# Patient Record
Sex: Female | Born: 1960 | Hispanic: Yes | Marital: Married | Smoking: Never smoker
Health system: Southern US, Community
[De-identification: ages and names within clinical notes are randomized; demographics above are authoritative.]

---

## 2009-04-02 ENCOUNTER — Ambulatory Visit: Payer: Self-pay | Admitting: Internal Medicine

## 2009-04-02 ENCOUNTER — Encounter (INDEPENDENT_AMBULATORY_CARE_PROVIDER_SITE_OTHER): Payer: Self-pay | Admitting: Family Medicine

## 2009-04-02 LAB — CONVERTED CEMR LAB
AST: 16 units/L (ref 0–37)
BUN: 18 mg/dL (ref 6–23)
Basophils Relative: 1 % (ref 0–1)
CO2: 26 meq/L (ref 19–32)
Calcium: 9.2 mg/dL (ref 8.4–10.5)
Chloride: 103 meq/L (ref 96–112)
Creatinine, Ser: 0.68 mg/dL (ref 0.40–1.20)
Eosinophils Relative: 2 % (ref 0–5)
Glucose, Bld: 86 mg/dL (ref 70–99)
HCT: 42 % (ref 36.0–46.0)
Hemoglobin: 13.2 g/dL (ref 12.0–15.0)
Lymphocytes Relative: 31 % (ref 12–46)
MCV: 90.5 fL (ref 78.0–100.0)
Monocytes Relative: 5 % (ref 3–12)
Potassium: 3.9 meq/L (ref 3.5–5.3)
RBC: 4.64 M/uL (ref 3.87–5.11)
Sodium: 139 meq/L (ref 135–145)
TSH: 3.918 microintl units/mL (ref 0.350–4.500)
Total Bilirubin: 1.5 mg/dL — ABNORMAL HIGH (ref 0.3–1.2)
Vit D, 25-Hydroxy: 17 ng/mL — ABNORMAL LOW (ref 30–89)
WBC: 4.6 10*3/uL (ref 4.0–10.5)

## 2009-05-07 ENCOUNTER — Ambulatory Visit: Payer: Self-pay | Admitting: Internal Medicine

## 2009-05-14 ENCOUNTER — Ambulatory Visit (HOSPITAL_COMMUNITY): Admission: RE | Admit: 2009-05-14 | Discharge: 2009-05-14 | Payer: Self-pay | Admitting: Internal Medicine

## 2009-07-09 ENCOUNTER — Ambulatory Visit: Payer: Self-pay | Admitting: Internal Medicine

## 2010-10-13 ENCOUNTER — Other Ambulatory Visit (HOSPITAL_COMMUNITY): Payer: Self-pay | Admitting: Family Medicine

## 2010-10-13 DIAGNOSIS — N939 Abnormal uterine and vaginal bleeding, unspecified: Secondary | ICD-10-CM

## 2010-10-19 ENCOUNTER — Inpatient Hospital Stay (HOSPITAL_COMMUNITY): Admission: RE | Admit: 2010-10-19 | Payer: Self-pay | Source: Ambulatory Visit

## 2010-10-19 ENCOUNTER — Ambulatory Visit (HOSPITAL_COMMUNITY)
Admission: RE | Admit: 2010-10-19 | Discharge: 2010-10-19 | Disposition: A | Discharge status: Home / Self Care | Payer: Self-pay | Source: Ambulatory Visit | Attending: Family Medicine | Admitting: Family Medicine

## 2010-10-19 DIAGNOSIS — N939 Abnormal uterine and vaginal bleeding, unspecified: Secondary | ICD-10-CM

## 2010-10-19 DIAGNOSIS — N83209 Unspecified ovarian cyst, unspecified side: Secondary | ICD-10-CM | POA: Insufficient documentation

## 2010-10-19 DIAGNOSIS — N898 Other specified noninflammatory disorders of vagina: Secondary | ICD-10-CM | POA: Insufficient documentation

## 2018-07-25 ENCOUNTER — Ambulatory Visit (HOSPITAL_COMMUNITY)
Admission: EM | Admit: 2018-07-25 | Discharge: 2018-07-25 | Disposition: A | Discharge status: Home / Self Care | Payer: Self-pay | Attending: Family Medicine | Admitting: Family Medicine

## 2018-07-25 ENCOUNTER — Encounter (HOSPITAL_COMMUNITY): Payer: Self-pay | Admitting: Emergency Medicine

## 2018-07-25 DIAGNOSIS — K2100 Gastro-esophageal reflux disease with esophagitis, without bleeding: Secondary | ICD-10-CM

## 2018-07-25 MED ORDER — OMEPRAZOLE 40 MG PO CPDR: 40.0000 mg | DELAYED_RELEASE_CAPSULE | Freq: Every day | ORAL | 0 refills | Status: AC

## 2018-07-25 NOTE — ED Triage Notes (Signed)
Pt c/o RLQ abdominal pain and tenderness x3 days. Tender to palpation.

## 2018-07-25 NOTE — ED Provider Notes (Signed)
[facility]    CSN: [id number] Arrival date & time: 07/25/18  1237     History   Chief Complaint Chief Complaint  Patient presents with  . Abdominal Pain    HPI Tanya Sims is a 58 y.o. female with a remote history of GERD presenting for right upper quadrant, epigastric pain and retrosternal burning sensation.  Patient states that she last had this a year ago, was found to have GERD.  Patient states that the medication she took Beckman was very helpful.  Patient states her symptoms have worsened over the last few days.  Patient states that she was nauseous yesterday from the pain.  Patient has tried Tums which helps alleviate her symptoms.  Denies fever, recent illness, decreased appetite, chest pain, shortness of breath, cough, radiation of pain.  Patient denies abdominal surgery, history of pancreatitis.  Patient denies drinking or smoking, does not routinely take any medications.  She has not noticed any triggers for her pain, cannot discern certain foods worsening her symptoms.  She denies history of constipation: Last bowel movement was yesterday without watery secretions, blood, melena.    History reviewed. No pertinent past medical history.  There are no active problems to display for this patient.   History reviewed. No pertinent surgical history.  OB History   No obstetric history on file.      Home Medications    Prior to Admission medications   Medication Sig Start Date End Date Taking? Authorizing Provider  omeprazole (PRILOSEC) 40 MG capsule Take 1 capsule (40 mg total) by mouth daily for 30 days. 07/25/18 08/24/18  Hall-Potvin, [place], PA-C    Family History No family history on file.  Social History Social History   Tobacco Use  . Smoking status: Not on file  Substance Use Topics  . Alcohol use: Not on file  . Drug use: Not on file     Allergies   Patient has no known allergies.   Review of Systems As per HPI   Physical  Exam Triage Vital Signs ED Triage Vitals  Enc Vitals Group     BP 07/25/18 1348 115/62     Pulse Rate 07/25/18 1346 98     Resp 07/25/18 1346 18     Temp 07/25/18 1346 98.5 F (36.9 C)     Temp src --      SpO2 07/25/18 1346 100 %     Weight --      Height --      Head Circumference --      Peak Flow --      Pain Score 07/25/18 1347 6     Pain Loc --      Pain Edu? --      Excl. in GC? --    No data found.  Updated Vital Signs BP 115/62   Pulse (!) 105   Temp 98.5 F (36.9 C)   Resp 18   SpO2 100%   Visual Acuity Right Eye Distance:   Left Eye Distance:   Bilateral Distance:    Right Eye Near:   Left Eye Near:    Bilateral Near:     Physical Exam Vitals signs and nursing note reviewed.  Constitutional:      General: She is not in acute distress. HENT:     Head: Normocephalic and atraumatic.     Mouth/Throat:     Mouth: Mucous membranes are moist.  Eyes:     General: No scleral icterus.  Pupils: Pupils are equal, round, and reactive to light.  Cardiovascular:     Rate and Rhythm: Normal rate and regular rhythm.     Heart sounds: Normal heart sounds.  Pulmonary:     Effort: Pulmonary effort is normal.  Abdominal:     General: Bowel sounds are normal. There is no distension or abdominal bruit. There are no signs of injury.     Palpations: Abdomen is soft. There is splenomegaly. There is no hepatomegaly, mass or pulsatile mass.     Tenderness: There is abdominal tenderness in the right upper quadrant, right lower quadrant and epigastric area. There is no right CVA tenderness, left CVA tenderness, guarding or rebound. Negative signs include Murphy's sign and McBurney's sign.     Comments: Mildly tender to right lower quadrant, right upper quadrant, epigastric area.  Skin:    General: Skin is warm.     Capillary Refill: Capillary refill takes less than 2 seconds.     Coloration: Skin is not cyanotic, jaundiced, mottled or pale.  Neurological:     General:  No focal deficit present.     Mental Status: She is alert and oriented to person, place, and time.      UC Treatments / Results  Labs (all labs ordered are listed, but only abnormal results are displayed) Labs Reviewed - No data to display  EKG None  Radiology No results found.  Procedures Procedures (including critical care time)  Medications Ordered in UC Medications - No data to display  Initial Impression / Assessment and Plan / UC Course  I have reviewed the triage vital signs and the nursing notes.  Pertinent labs & imaging results that were available during my care of the patient were reviewed by me and considered in my medical decision making (see chart for details).     Pleasant 58 year old Hispanic female with remote history of GERD presenting for upper, right-sided abdominal pain and retrosternal burning sensation.  History and physical exam highly suggestive of GERD with esophagitis.  Low concern for cholecystitis, cholelithiasis, pancreatitis, appendicitis diverticulosis.  Will start PPI, may take OTC Tums for additional relief, follow-up with PCP.  Discussed return precautions, patient verbalized understanding.  Final Clinical Impressions(s) / UC Diagnoses   Final diagnoses:  Gastroesophageal reflux disease with esophagitis     Discharge Instructions     Take Prilosec daily. Return if you develop worsening pain, nausea, vomiting, fever, decreased appetite.    ED Prescriptions    Medication Sig Dispense Auth. Provider   omeprazole (PRILOSEC) 40 MG capsule Take 1 capsule (40 mg total) by mouth daily for 30 days. 30 capsule Hall-Potvin, [place], PA-C     Controlled Substance Prescriptions [place] Controlled Substance Registry consulted? Not Applicable   Quincy Sheehan, [place] 07/25/18 1413

## 2018-07-25 NOTE — Discharge Instructions (Addendum)
Take Prilosec daily. Return if you develop worsening pain, nausea, vomiting, fever, decreased appetite.

## 2018-07-28 ENCOUNTER — Inpatient Hospital Stay (HOSPITAL_BASED_OUTPATIENT_CLINIC_OR_DEPARTMENT_OTHER)
Admission: EM | Admit: 2018-07-28 | Discharge: 2018-08-04 | DRG: 177 | Disposition: A | Discharge status: Home / Self Care | Payer: HRSA Program | Attending: Internal Medicine | Admitting: Internal Medicine

## 2018-07-28 ENCOUNTER — Emergency Department (HOSPITAL_BASED_OUTPATIENT_CLINIC_OR_DEPARTMENT_OTHER): Payer: HRSA Program

## 2018-07-28 ENCOUNTER — Other Ambulatory Visit: Payer: Self-pay

## 2018-07-28 ENCOUNTER — Encounter (HOSPITAL_BASED_OUTPATIENT_CLINIC_OR_DEPARTMENT_OTHER): Payer: Self-pay | Admitting: *Deleted

## 2018-07-28 DIAGNOSIS — Z20828 Contact with and (suspected) exposure to other viral communicable diseases: Secondary | ICD-10-CM | POA: Diagnosis present

## 2018-07-28 DIAGNOSIS — J9601 Acute respiratory failure with hypoxia: Secondary | ICD-10-CM | POA: Diagnosis present

## 2018-07-28 DIAGNOSIS — K219 Gastro-esophageal reflux disease without esophagitis: Secondary | ICD-10-CM | POA: Diagnosis present

## 2018-07-28 DIAGNOSIS — J1289 Other viral pneumonia: Secondary | ICD-10-CM | POA: Diagnosis present

## 2018-07-28 DIAGNOSIS — R0602 Shortness of breath: Secondary | ICD-10-CM

## 2018-07-28 DIAGNOSIS — A419 Sepsis, unspecified organism: Secondary | ICD-10-CM

## 2018-07-28 DIAGNOSIS — R509 Fever, unspecified: Secondary | ICD-10-CM | POA: Diagnosis not present

## 2018-07-28 DIAGNOSIS — U071 COVID-19: Secondary | ICD-10-CM | POA: Diagnosis not present

## 2018-07-28 DIAGNOSIS — M940 Chondrocostal junction syndrome [Tietze]: Secondary | ICD-10-CM | POA: Diagnosis present

## 2018-07-28 DIAGNOSIS — A4189 Other specified sepsis: Secondary | ICD-10-CM | POA: Diagnosis present

## 2018-07-28 DIAGNOSIS — R1011 Right upper quadrant pain: Secondary | ICD-10-CM

## 2018-07-28 DIAGNOSIS — J129 Viral pneumonia, unspecified: Secondary | ICD-10-CM

## 2018-07-28 DIAGNOSIS — Z20822 Contact with and (suspected) exposure to covid-19: Secondary | ICD-10-CM | POA: Diagnosis present

## 2018-07-28 LAB — CBC WITH DIFFERENTIAL/PLATELET
Abs Immature Granulocytes: 0.05 10*3/uL (ref 0.00–0.07)
Basophils Absolute: 0 10*3/uL (ref 0.0–0.1)
Basophils Relative: 0 %
Eosinophils Absolute: 0 10*3/uL (ref 0.0–0.5)
Eosinophils Relative: 0 %
HCT: 38.7 % (ref 36.0–46.0)
Hemoglobin: 12.9 g/dL (ref 12.0–15.0)
Immature Granulocytes: 1 %
Lymphocytes Relative: 8 %
Lymphs Abs: 0.7 10*3/uL (ref 0.7–4.0)
MCH: 28.8 pg (ref 26.0–34.0)
MCHC: 33.3 g/dL (ref 30.0–36.0)
MCV: 86.4 fL (ref 80.0–100.0)
Monocytes Absolute: 0.3 10*3/uL (ref 0.1–1.0)
Monocytes Relative: 3 %
Neutro Abs: 7.4 10*3/uL (ref 1.7–7.7)
Neutrophils Relative %: 88 %
Platelets: 280 10*3/uL (ref 150–400)
RBC: 4.48 MIL/uL (ref 3.87–5.11)
RDW: 13.2 % (ref 11.5–15.5)
WBC: 8.4 10*3/uL (ref 4.0–10.5)
nRBC: 0 % (ref 0.0–0.2)

## 2018-07-28 LAB — COMPREHENSIVE METABOLIC PANEL
ALT: 65 U/L — ABNORMAL HIGH (ref 0–44)
AST: 55 U/L — ABNORMAL HIGH (ref 15–41)
Albumin: 3.4 g/dL — ABNORMAL LOW (ref 3.5–5.0)
Alkaline Phosphatase: 95 U/L (ref 38–126)
Anion gap: 11 (ref 5–15)
BUN: 8 mg/dL (ref 6–20)
CO2: 20 mmol/L — ABNORMAL LOW (ref 22–32)
Calcium: 8.1 mg/dL — ABNORMAL LOW (ref 8.9–10.3)
Chloride: 99 mmol/L (ref 98–111)
Creatinine, Ser: 0.63 mg/dL (ref 0.44–1.00)
GFR calc Af Amer: >60 mL/min (ref >60)
GFR calc non Af Amer: >60 mL/min (ref >60)
Glucose, Bld: 129 mg/dL — ABNORMAL HIGH (ref 70–99)
Potassium: 3.5 mmol/L (ref 3.5–5.1)
Sodium: 130 mmol/L — ABNORMAL LOW (ref 135–145)
Total Bilirubin: 0.7 mg/dL (ref 0.3–1.2)
Total Protein: 7.3 g/dL (ref 6.5–8.1)

## 2018-07-28 LAB — SARS CORONAVIRUS 2 AG (30 MIN TAT): SARS Coronavirus 2 Ag: NEGATIVE

## 2018-07-28 LAB — D-DIMER, QUANTITATIVE: D-Dimer, Quant: 0.83 ug/mL-FEU — ABNORMAL HIGH (ref 0.00–0.50)

## 2018-07-28 LAB — LIPASE, BLOOD: Lipase: 54 U/L — ABNORMAL HIGH (ref 11–51)

## 2018-07-28 LAB — LACTIC ACID, PLASMA: Lactic Acid, Venous: 0.9 mmol/L (ref 0.5–1.9)

## 2018-07-28 MED ORDER — ACETAMINOPHEN 500 MG PO TABS
1000.0000 mg | ORAL_TABLET | Freq: Once | ORAL | Status: AC
  Administered 2018-07-28: 1000 mg via ORAL
  Filled 2018-07-28: qty 2

## 2018-07-28 MED ORDER — PANTOPRAZOLE SODIUM 40 MG IV SOLR
40.0000 mg | Freq: Once | INTRAVENOUS | Status: AC
  Administered 2018-07-28: 40 mg via INTRAVENOUS
  Filled 2018-07-28: qty 40

## 2018-07-28 NOTE — ED Notes (Signed)
Increased oxygen to 3 L via [place] at this time.  To continue to monitor.  Breathing easy and nonlabored currently.

## 2018-07-28 NOTE — ED Notes (Signed)
Pt placed on 2L oxygen by [place]. Sats 93%

## 2018-07-28 NOTE — ED Notes (Signed)
Per EDMD, putting exam on hold until all other pt's requiring CT imaging have been imaged due to the possibility of shutting room down for 70 minutes due to aerosolizing of possible covid.

## 2018-07-28 NOTE — ED Triage Notes (Signed)
Pt reports fever 8 or 9 days. She lives with a family member who is currently hospitalized with covid. C/o diarrhea x 3 today. Intermittent SOB. Dry cough

## 2018-07-28 NOTE — ED Provider Notes (Signed)
[facility] EMERGENCY DEPARTMENT Provider Note   CSN: [id number] Arrival date & time: 07/28/18  2117    History   Chief Complaint Chief Complaint  Patient presents with  . Fever    HPI Tanya Sims is a 58 y.o. female.     Patient is a 58 year old female who presents with fever and cough.  History was obtained through the video language line.  She states she has been feeling bad for about 7 days.  She has had coughing and chest congestion.  She has had some intermittent shortness of breath but denies any now.  She has been having some tenderness in her right upper abdomen since his symptoms started.  No nausea or vomiting.  She has had some loose stools.  She does have a family member who lives in the same household that is currently hospitalized for COVID.  She was recently seen in urgent care 3 days ago for the epigastric and right upper quadrant pain.  She reported that she had similar symptoms in the past with GERD.     History reviewed. No pertinent past medical history.  There are no active problems to display for this patient.   History reviewed. No pertinent surgical history.   OB History   No obstetric history on file.      Home Medications    Prior to Admission medications   Medication Sig Start Date End Date Taking? Authorizing Provider  omeprazole (PRILOSEC) 40 MG capsule Take 1 capsule (40 mg total) by mouth daily for 30 days. 07/25/18 08/24/18  Hall-Potvin, [place], PA-C    Family History No family history on file.  Social History Social History   Tobacco Use  . Smoking status: Never Smoker  . Smokeless tobacco: Never Used  Substance Use Topics  . Alcohol use: Never    Frequency: Never  . Drug use: Never     Allergies   Patient has no known allergies.   Review of Systems Review of Systems  Constitutional: Positive for fatigue. Negative for chills, diaphoresis and fever.  HENT: Negative for congestion, rhinorrhea and  sneezing.   Eyes: Negative.   Respiratory: Positive for cough and shortness of breath. Negative for chest tightness.   Cardiovascular: Negative for chest pain and leg swelling.  Gastrointestinal: Positive for abdominal pain and diarrhea. Negative for blood in stool, nausea and vomiting.  Genitourinary: Negative for difficulty urinating, flank pain, frequency and hematuria.  Musculoskeletal: Positive for myalgias. Negative for arthralgias and back pain.  Skin: Negative for rash.  Neurological: Negative for dizziness, speech difficulty, weakness, numbness and headaches.     Physical Exam Updated Vital Signs BP 107/66   Pulse (!) 102   Temp 99.5 F (37.5 C) (Oral)   Resp (!) 23   Ht 5' 3.78" (1.62 m)   Wt 72.6 kg   SpO2 95% Comment: on high flow O2 at 8L  BMI 27.65 kg/m   Physical Exam Constitutional:      Appearance: She is well-developed.  HENT:     Head: Normocephalic and atraumatic.  Eyes:     Pupils: Pupils are equal, round, and reactive to light.  Neck:     Musculoskeletal: Normal range of motion and neck supple.  Cardiovascular:     Rate and Rhythm: Normal rate and regular rhythm.     Heart sounds: Normal heart sounds.  Pulmonary:     Effort: Pulmonary effort is normal. No respiratory distress.     Breath sounds: No wheezing.  Chest:  Chest wall: No tenderness.  Abdominal:     General: Bowel sounds are normal.     Palpations: Abdomen is soft.     Tenderness: There is abdominal tenderness in the right upper quadrant. There is no guarding or rebound.  Musculoskeletal: Normal range of motion.  Lymphadenopathy:     Cervical: No cervical adenopathy.  Skin:    General: Skin is warm and dry.     Findings: No rash.  Neurological:     Mental Status: She is alert and oriented to person, place, and time.      ED Treatments / Results  Labs (all labs ordered are listed, but only abnormal results are displayed) Labs Reviewed  COMPREHENSIVE METABOLIC PANEL -  Abnormal; Notable for the following components:      Result Value   Sodium 130 (*)    CO2 20 (*)    Glucose, Bld 129 (*)    Calcium 8.1 (*)    Albumin 3.4 (*)    AST 55 (*)    ALT 65 (*)    All other components within normal limits  LIPASE, BLOOD - Abnormal; Notable for the following components:   Lipase 54 (*)    All other components within normal limits  D-DIMER, QUANTITATIVE (NOT AT [facility]) - Abnormal; Notable for the following components:   D-Dimer, Quant 0.83 (*)    All other components within normal limits  CULTURE, BLOOD (ROUTINE X 2)  CULTURE, BLOOD (ROUTINE X 2)  SARS CORONAVIRUS 2 (HOSP ORDER, PERFORMED IN [place] LAB VIA [employer]OTT ID)  CBC WITH DIFFERENTIAL/PLATELET  LACTIC ACID, PLASMA  LACTIC ACID, PLASMA  PROCALCITONIN  LACTATE DEHYDROGENASE  FERRITIN  TRIGLYCERIDES  FIBRINOGEN  C-REACTIVE PROTEIN    EKG EKG Interpretation  Date/Time:  Saturday July 28 2018 22:29:57 EDT Ventricular Rate:  109 PR Interval:    QRS Duration: 84 QT Interval:  323 QTC Calculation: 435 R Axis:   46 Text Interpretation:  Sinus tachycardia Borderline low voltage, extremity leads No old tracing to compare Confirmed by Malvin Johns [phone]) on 07/28/2018 10:37:59 PM   Radiology No results found.  Procedures Procedures (including critical care time)  Medications Ordered in ED Medications  pantoprazole (PROTONIX) injection 40 mg (40 mg Intravenous Given 07/28/18 2258)  acetaminophen (TYLENOL) tablet 1,000 mg (1,000 mg Oral Given 07/28/18 2205)     Initial Impression / Assessment and Plan / ED Course  I have reviewed the triage vital signs and the nursing notes.  Pertinent labs & imaging results that were available during my care of the patient were reviewed by me and considered in my medical decision making (see chart for details).        Patient is a 58 year old female who presents with cough and fever with shortness of breath.  She has high likelihood of having COVID  given that there is been a family member in the same household who is currently being treated for COVID.  She is hypoxic and requiring nasal cannula oxygen at currently 6 L/min to maintain sats greater than 92.  At this point she is a little tachypneic but is not otherwise working hard to breathe.  She has no accessory muscle use.  She does have right upper quadrant abdominal pain which she has had for several days.  She also has some elevation in her LFTs and lipase.  I feel that she needs imaging of her gallbladder.  Were unable to get a gallbladder ultrasound so we will do a CT scan.  She will then need admission for appropriate treatment.  Dr. Daun PeacockPalombo to take over care pending the imaging studies.  Final Clinical Impressions(s) / ED Diagnoses   Final diagnoses:  RUQ pain    ED Discharge Orders    None       Rolan BuccoBelfi, Darlisa Spruiell, MD 07/28/18 2310

## 2018-07-28 NOTE — ED Notes (Signed)
Pt triaged and assessed by EDP using video interpreter

## 2018-07-29 ENCOUNTER — Emergency Department (HOSPITAL_BASED_OUTPATIENT_CLINIC_OR_DEPARTMENT_OTHER): Payer: HRSA Program

## 2018-07-29 DIAGNOSIS — A419 Sepsis, unspecified organism: Secondary | ICD-10-CM

## 2018-07-29 DIAGNOSIS — J1289 Other viral pneumonia: Secondary | ICD-10-CM | POA: Diagnosis present

## 2018-07-29 DIAGNOSIS — A4189 Other specified sepsis: Secondary | ICD-10-CM | POA: Diagnosis not present

## 2018-07-29 DIAGNOSIS — K219 Gastro-esophageal reflux disease without esophagitis: Secondary | ICD-10-CM | POA: Diagnosis not present

## 2018-07-29 DIAGNOSIS — J9601 Acute respiratory failure with hypoxia: Secondary | ICD-10-CM | POA: Diagnosis not present

## 2018-07-29 DIAGNOSIS — R1011 Right upper quadrant pain: Secondary | ICD-10-CM

## 2018-07-29 DIAGNOSIS — Z20828 Contact with and (suspected) exposure to other viral communicable diseases: Secondary | ICD-10-CM | POA: Diagnosis not present

## 2018-07-29 DIAGNOSIS — M940 Chondrocostal junction syndrome [Tietze]: Secondary | ICD-10-CM | POA: Diagnosis not present

## 2018-07-29 DIAGNOSIS — R6889 Other general symptoms and signs: Secondary | ICD-10-CM

## 2018-07-29 DIAGNOSIS — Z20822 Contact with and (suspected) exposure to covid-19: Secondary | ICD-10-CM | POA: Diagnosis present

## 2018-07-29 DIAGNOSIS — U071 COVID-19: Secondary | ICD-10-CM | POA: Diagnosis not present

## 2018-07-29 DIAGNOSIS — R509 Fever, unspecified: Secondary | ICD-10-CM | POA: Diagnosis present

## 2018-07-29 DIAGNOSIS — J129 Viral pneumonia, unspecified: Secondary | ICD-10-CM

## 2018-07-29 LAB — CBC
HCT: 40.2 % (ref 36.0–46.0)
Hemoglobin: 12.7 g/dL (ref 12.0–15.0)
MCH: 27.8 pg (ref 26.0–34.0)
MCHC: 31.6 g/dL (ref 30.0–36.0)
MCV: 88 fL (ref 80.0–100.0)
Platelets: 322 10*3/uL (ref 150–400)
RBC: 4.57 MIL/uL (ref 3.87–5.11)
RDW: 13.5 % (ref 11.5–15.5)
WBC: 10.5 10*3/uL (ref 4.0–10.5)
nRBC: 0 % (ref 0.0–0.2)

## 2018-07-29 LAB — TRIGLYCERIDES: Triglycerides: 107 mg/dL (ref <150)

## 2018-07-29 LAB — RESPIRATORY PANEL BY PCR

## 2018-07-29 LAB — ABO/RH: ABO/RH(D): A POS

## 2018-07-29 LAB — TYPE AND SCREEN
ABO/RH(D): A POS
Antibody Screen: NEGATIVE

## 2018-07-29 LAB — TROPONIN I (HIGH SENSITIVITY)
Troponin I (High Sensitivity): 12 ng/L (ref <18)
Troponin I (High Sensitivity): 13 ng/L (ref <18)

## 2018-07-29 LAB — FERRITIN: Ferritin: 710 ng/mL — ABNORMAL HIGH (ref 11–307)

## 2018-07-29 LAB — CREATININE, SERUM
Creatinine, Ser: 0.79 mg/dL (ref 0.44–1.00)
GFR calc Af Amer: >60 mL/min (ref >60)
GFR calc non Af Amer: >60 mL/min (ref >60)

## 2018-07-29 LAB — INFLUENZA PANEL BY PCR (TYPE A & B)
Influenza A By PCR: NEGATIVE
Influenza B By PCR: NEGATIVE

## 2018-07-29 LAB — PROCALCITONIN: Procalcitonin: <0.1 ng/mL

## 2018-07-29 LAB — LACTATE DEHYDROGENASE: LDH: 247 U/L — ABNORMAL HIGH (ref 98–192)

## 2018-07-29 LAB — FIBRINOGEN: Fibrinogen: 744 mg/dL — ABNORMAL HIGH (ref 210–475)

## 2018-07-29 LAB — C-REACTIVE PROTEIN: CRP: 13.1 mg/dL — ABNORMAL HIGH (ref <1.0)

## 2018-07-29 LAB — MRSA PCR SCREENING: MRSA by PCR: NEGATIVE

## 2018-07-29 LAB — SARS CORONAVIRUS 2 BY RT PCR (HOSPITAL ORDER, PERFORMED IN ~~LOC~~ HOSPITAL LAB): SARS Coronavirus 2: POSITIVE — AB

## 2018-07-29 MED ORDER — BISACODYL 10 MG RE SUPP: 10.0000 mg | Freq: Every day | RECTAL | Status: DC | PRN

## 2018-07-29 MED ORDER — KETOROLAC TROMETHAMINE 30 MG/ML IJ SOLN
15.0000 mg | Freq: Once | INTRAMUSCULAR | Status: AC
  Administered 2018-07-29: 15 mg via INTRAVENOUS
  Filled 2018-07-29: qty 1

## 2018-07-29 MED ORDER — ORAL CARE MOUTH RINSE
15.0000 mL | Freq: Two times a day (BID) | OROMUCOSAL | Status: DC
  Administered 2018-07-29 – 2018-08-04 (×13): 15 mL via OROMUCOSAL

## 2018-07-29 MED ORDER — ONDANSETRON HCL 4 MG/2ML IJ SOLN: 4.0000 mg | Freq: Four times a day (QID) | INTRAMUSCULAR | Status: DC | PRN

## 2018-07-29 MED ORDER — SODIUM CHLORIDE 0.9 % IV SOLN
100.0000 mg | INTRAVENOUS | Status: DC
  Administered 2018-07-30 – 2018-07-31 (×2): 100 mg via INTRAVENOUS
  Filled 2018-07-29 (×3): qty 20

## 2018-07-29 MED ORDER — TOCILIZUMAB 400 MG/20ML IV SOLN
600.0000 mg | Freq: Once | INTRAVENOUS | Status: AC
  Administered 2018-07-29: 600 mg via INTRAVENOUS
  Filled 2018-07-29: qty 20

## 2018-07-29 MED ORDER — ENOXAPARIN SODIUM 40 MG/0.4ML ~~LOC~~ SOLN
40.0000 mg | SUBCUTANEOUS | Status: DC
  Administered 2018-07-29: 40 mg via SUBCUTANEOUS
  Filled 2018-07-29: qty 0.4

## 2018-07-29 MED ORDER — DEXAMETHASONE SODIUM PHOSPHATE 10 MG/ML IJ SOLN
6.0000 mg | Freq: Once | INTRAMUSCULAR | Status: AC
  Administered 2018-07-29: 6 mg via INTRAVENOUS
  Filled 2018-07-29: qty 1

## 2018-07-29 MED ORDER — CHLORHEXIDINE GLUCONATE CLOTH 2 % EX PADS
6.0000 | MEDICATED_PAD | Freq: Every day | CUTANEOUS | Status: DC
  Administered 2018-07-29 – 2018-08-04 (×7): 6 via TOPICAL

## 2018-07-29 MED ORDER — ALBUTEROL SULFATE HFA 108 (90 BASE) MCG/ACT IN AERS
2.0000 | INHALATION_SPRAY | Freq: Four times a day (QID) | RESPIRATORY_TRACT | Status: DC
  Administered 2018-07-29 – 2018-08-03 (×22): 2 via RESPIRATORY_TRACT
  Filled 2018-07-29 (×2): qty 6.7

## 2018-07-29 MED ORDER — LACTATED RINGERS IV SOLN
INTRAVENOUS | Status: AC
  Administered 2018-07-29: 17:00:00 via INTRAVENOUS

## 2018-07-29 MED ORDER — ACETAMINOPHEN 500 MG PO TABS
1000.0000 mg | ORAL_TABLET | Freq: Once | ORAL | Status: AC
  Administered 2018-07-29: 1000 mg via ORAL
  Filled 2018-07-29: qty 2

## 2018-07-29 MED ORDER — SENNOSIDES-DOCUSATE SODIUM 8.6-50 MG PO TABS: 1.0000 | ORAL_TABLET | Freq: Every evening | ORAL | Status: DC | PRN

## 2018-07-29 MED ORDER — PANTOPRAZOLE SODIUM 40 MG PO TBEC
40.0000 mg | DELAYED_RELEASE_TABLET | Freq: Every day | ORAL | Status: DC
  Administered 2018-07-29 – 2018-08-04 (×7): 40 mg via ORAL
  Filled 2018-07-29 (×7): qty 1

## 2018-07-29 MED ORDER — SODIUM CHLORIDE 0.9 % IV SOLN
200.0000 mg | Freq: Once | INTRAVENOUS | Status: AC
  Administered 2018-07-29: 200 mg via INTRAVENOUS
  Filled 2018-07-29: qty 40

## 2018-07-29 MED ORDER — IOHEXOL 300 MG/ML  SOLN
100.0000 mL | Freq: Once | INTRAMUSCULAR | Status: AC | PRN
  Administered 2018-07-29: 100 mL via INTRAVENOUS

## 2018-07-29 MED ORDER — ACETAMINOPHEN 325 MG PO TABS
650.0000 mg | ORAL_TABLET | Freq: Four times a day (QID) | ORAL | Status: DC | PRN
  Administered 2018-07-29 – 2018-07-30 (×2): 650 mg via ORAL
  Filled 2018-07-29 (×2): qty 2

## 2018-07-29 MED ORDER — SODIUM CHLORIDE 0.9% FLUSH
3.0000 mL | Freq: Two times a day (BID) | INTRAVENOUS | Status: DC
  Administered 2018-07-29 – 2018-08-04 (×12): 3 mL via INTRAVENOUS

## 2018-07-29 MED ORDER — ONDANSETRON HCL 4 MG PO TABS: 4.0000 mg | ORAL_TABLET | Freq: Four times a day (QID) | ORAL | Status: DC | PRN

## 2018-07-29 MED ORDER — METHYLPREDNISOLONE SODIUM SUCC 125 MG IJ SOLR
60.0000 mg | Freq: Two times a day (BID) | INTRAMUSCULAR | Status: DC
  Administered 2018-07-29 – 2018-08-02 (×9): 60 mg via INTRAVENOUS
  Filled 2018-07-29 (×9): qty 2

## 2018-07-29 NOTE — ED Notes (Signed)
Report received 

## 2018-07-29 NOTE — ED Notes (Signed)
ED TO INPATIENT HANDOFF REPORT  ED Nurse Name and Phone #: Chauncey MannSusanna  S Name/Age/Gender Alizey Birr 58 y.o. female Room/Bed: MH11/MH11  Code Status   Code Status: Not on file  Home/SNF/Other Home Patient oriented to: self, place, time and situation Is this baseline? Yes   Triage Complete: Triage complete  Chief Complaint Fever, Headache, Diarrhea, Body Pain  Triage Note Pt reports fever 8 or 9 days. She lives with a family member who is currently hospitalized with covid. C/o diarrhea x 3 today. Intermittent SOB. Dry cough   Allergies No Known Allergies  Level of Care/Admitting Diagnosis ED Disposition    ED Disposition Condition Comment   Admit  The patient appears reasonably stabilized for admission considering the current resources, flow, and capabilities available in the ED at this time, and I doubt any other [facility] requiring further screening and/or treatment in the ED prior to admission is  present.       B Medical/Surgery History History reviewed. No pertinent past medical history. History reviewed. No pertinent surgical history.   A IV Location/Drains/Wounds Patient Lines/Drains/Airways Status   Active Line/Drains/Airways    Name:   Placement date:   Placement time:   Site:   Days:   Peripheral IV 07/28/18 Left Antecubital   07/28/18    2200    Antecubital   1   Peripheral IV 07/28/18 Right Forearm   07/28/18    2236    Forearm   1          Intake/Output Last 24 hours No intake or output data in the 24 hours ending 07/29/18 0147  Labs/Imaging Results for orders placed or performed during the hospital encounter of 07/28/18 (from the past 48 hour(s))  Comprehensive metabolic panel     Status: Abnormal   Collection Time: 07/28/18 10:10 PM  Result Value Ref Range   Sodium 130 (L) 135 - 145 mmol/L   Potassium 3.5 3.5 - 5.1 mmol/L   Chloride 99 98 - 111 mmol/L   CO2 20 (L) 22 - 32 mmol/L   Glucose, Bld 129 (H) 70 - 99 mg/dL   BUN 8 6 - 20 mg/dL   Creatinine, Ser 0.980.63 0.44 - 1.00 mg/dL   Calcium 8.1 (L) 8.9 - 10.3 mg/dL   Total Protein 7.3 6.5 - 8.1 g/dL   Albumin 3.4 (L) 3.5 - 5.0 g/dL   AST 55 (H) 15 - 41 U/L   ALT 65 (H) 0 - 44 U/L   Alkaline Phosphatase 95 38 - 126 U/L   Total Bilirubin 0.7 0.3 - 1.2 mg/dL   GFR calc non Af Amer >60 >60 mL/min   GFR calc Af Amer >60 >60 mL/min   Anion gap 11 5 - 15    Comment: Performed at [facility], [address] [place], [place] [address]  CBC with Differential     Status: None   Collection Time: 07/28/18 10:10 PM  Result Value Ref Range   WBC 8.4 4.0 - 10.5 K/uL   RBC 4.48 3.87 - 5.11 MIL/uL   Hemoglobin 12.9 12.0 - 15.0 g/dL   HCT 78.238.7 95.636.0 - 21.346.0 %   MCV 86.4 80.0 - 100.0 fL   MCH 28.8 26.0 - 34.0 pg   MCHC 33.3 30.0 - 36.0 g/dL   RDW 08.613.2 57.811.5 - 46.915.5 %   Platelets 280 150 - 400 K/uL   nRBC 0.0 0.0 - 0.2 %   Neutrophils Relative % 88 %   Neutro Abs  7.4 1.7 - 7.7 K/uL   Lymphocytes Relative 8 %   Lymphs Abs 0.7 0.7 - 4.0 K/uL   Monocytes Relative 3 %   Monocytes Absolute 0.3 0.1 - 1.0 K/uL   Eosinophils Relative 0 %   Eosinophils Absolute 0.0 0.0 - 0.5 K/uL   Basophils Relative 0 %   Basophils Absolute 0.0 0.0 - 0.1 K/uL   Immature Granulocytes 1 %   Abs Immature Granulocytes 0.05 0.00 - 0.07 K/uL    Comment: Performed at [facility] [place], [address] [place], [place] [address]  Lipase, blood     Status: Abnormal   Collection Time: 07/28/18 10:10 PM  Result Value Ref Range   Lipase 54 (H) 11 - 51 U/L    Comment: Performed at [facility] [place], [address] [place], [place] [address]  Culture, blood (routine x 2)     Status: None (Preliminary result)   Collection Time: 07/28/18 10:10 PM   Specimen: BLOOD RIGHT ARM  Result Value Ref Range   Specimen Description      BLOOD RIGHT ARM Performed at [facility], [address] [place], [place] [address]    Special Requests      BOTTLES DRAWN AEROBIC AND ANAEROBIC Blood  Culture adequate volume Performed at [facility], [address] [place], [place] [address]    Culture PENDING    Report Status PENDING   Lactic acid, plasma     Status: None   Collection Time: 07/28/18 10:10 PM  Result Value Ref Range   Lactic Acid, Venous 0.9 0.5 - 1.9 mmol/L    Comment: Performed at [facility] [place], [address] [place], [place] [address]  D-dimer, quantitative     Status: Abnormal   Collection Time: 07/28/18 10:10 PM  Result Value Ref Range   D-Dimer, Quant 0.83 (H) 0.00 - 0.50 ug/mL-FEU    Comment: (NOTE) At the manufacturer cut-off of 0.50 ug/mL FEU, this assay has been documented to exclude PE with a sensitivity and negative predictive value of 97 to 99%.  At this time, this assay has not been approved by the FDA to exclude DVT/VTE. Results should be correlated with clinical presentation. Performed at [facility] [place], [address] [place], [place] [address]   SARS Coronavirus 2 (Hosp order,Performed in [facility] lab via Abbott ID)     Status: None   Collection Time: 07/28/18 11:00 PM   Specimen: Dry Nasal Swab (Abbott ID Now)  Result Value Ref Range   SARS Coronavirus 2 (Abbott ID Now) NEGATIVE NEGATIVE    Comment: (NOTE) Interpretive Result Comment(s): COVID 19 Positive SARS CoV 2 target nucleic acids are DETECTED. The SARS CoV 2 RNA is generally detectable in upper and lower respiratory specimens during the acute phase of infection.  Positive results are indicative of active infection with SARS CoV 2.  Clinical correlation with patient history and other diagnostic information is necessary to determine patient infection status.  Positive results do not rule out bacterial infection or coinfection with other viruses. The expected result is Negative. COVID 19 Negative SARS CoV 2 target nucleic acids are NOT DETECTED. The SARS CoV 2 RNA is generally detectable in upper and lower respiratory specimens during the acute phase  of infection.  Negative results do not preclude SARS CoV 2 infection, do not rule out coinfections with other pathogens, and should not be used as the sole basis for treatment  or other patient management decisions.  Negative results must be combined with clinical  observations, patient history, and epidemiological information. The expected result is Negative. Invalid Presence or absence of SARS CoV 2 nucleic acids cannot be determined. Repeat testing was performed on the submitted specimen and repeated Invalid results were obtained.  If clinically indicated, additional testing on a new specimen with an alternate test methodology [id number]) is advised.  The SARS CoV 2 RNA is generally detectable in upper and lower respiratory specimens during the acute phase of infection. The expected result is Negative. Fact Sheet for Patients:  http://www.graves-ford.org/https://www.fda.gov/media/136524/download Fact Sheet for Healthcare Providers: EnviroConcern.sihttps://www.fda.gov/media/136523/download This test is not yet approved or cleared by the [place] FDA and has been authorized for detection and/or diagnosis of SARS CoV 2 by FDA under an Emergency Use Authorization (EUA).  This EUA will remain in effect (meaning this test can be used) for the duration of the COVID19 d eclaration under Section 564(b)(1) of the Act, 21 U.S.C. section [phone] 3(b)(1), unless the authorization is terminated or revoked sooner. Performed at [facility] [place], [address] [place], [place] [address]    Dg [place]  Result Date: 07/28/2018 CLINICAL DATA:  Cough, fever, nausea, vomiting, and worsening chest pain for 8 days, has been in close contact with a COVID-19 positive individual EXAM: PORTABLE CHEST 1 VIEW COMPARISON:  Portable exam 2218 hours without priors for comparison FINDINGS: Normal heart size, mediastinal contours and pulmonary vascularity. Hazy infiltrate RIGHT mid lung. Additional infiltrate versus atelectasis at LEFT  base. Upper lungs clear. No pleural effusion or pneumothorax. Bones demineralized. IMPRESSION: Hazy infiltrate RIGHT mid lung with more prominent opacity at LEFT base which could represent infiltrate or atelectasis. Electronically Signed   By: Ulyses SouthwardMark  Boles M.D.   On: 07/28/2018 23:07    Pending Labs Unresulted Labs (From admission, onward)    Start     Ordered   07/28/18 2146  Procalcitonin  ONCE - STAT,   STAT     07/28/18 2146   07/28/18 2146  Lactate dehydrogenase  Once,   STAT     07/28/18 2146   07/28/18 2146  Ferritin  Once,   STAT     07/28/18 2146   07/28/18 2146  Triglycerides  Once,   STAT     07/28/18 2146   07/28/18 2146  Fibrinogen  Once,   STAT     07/28/18 2146   07/28/18 2146  C-reactive protein  Once,   STAT     07/28/18 2146   07/28/18 2145  Culture, blood (routine x 2)  BLOOD CULTURE X 2,   STAT    Question:  Patient immune status  Answer:  Normal   07/28/18 2146          Vitals/Pain Today's Vitals   07/29/18 0000 07/29/18 0030 07/29/18 0058 07/29/18 0100  BP: 105/65 (!) 103/55  107/67  Pulse: 89 85  80  Resp: (!) 24 (!) 22  (!) 22  Temp:      TempSrc:      SpO2: 98% 96%  95%  Weight:      Height:      PainSc:   Asleep     Isolation Precautions Airborne and Contact precautions  Medications Medications  pantoprazole (PROTONIX) injection 40 mg (40 mg Intravenous Given 07/28/18 2258)  acetaminophen (TYLENOL) tablet 1,000 mg (1,000 mg Oral Given 07/28/18 2205)  iohexol (OMNIPAQUE) 300 MG/ML solution 100 mL (100 mLs Intravenous Contrast Given 07/29/18  0145)    Mobility walks Low fall risk   Focused Assessments Cardiac Assessment Handoff:  Cardiac Rhythm: Sinus tachycardia No results found for: CKTOTAL, CKMB, CKMBINDEX, TROPONINI Lab Results  Component Value Date   DDIMER 0.83 (H) 07/28/2018   Does the Patient currently have chest pain? No     R Recommendations: See Admitting Provider Note  Report given to:   Additional Notes: patient  is hispanic.  Speaks little english.

## 2018-07-29 NOTE — ED Notes (Signed)
Report given to [facility], RN 

## 2018-07-29 NOTE — Progress Notes (Deleted)
On exertion to [facility]

## 2018-07-29 NOTE — Plan of Care (Addendum)
Patient going to have COVID and a false negative abbot test.  CT scan shows COVID like PNA  Family member from same house hold has confirmed [place] and is hospitalized with COVID currently.  Paitent with fever, cough, Tm 102.x, and new O2 requirement of 8L high flow.  Because abbot test negative, cannot send her directly to [facility].  Will send her to SDU here first.  Obtain Cephid test on arrival.  Airborne and contact isolation.  [facility] informed about high suspicion of COVID and to take appropriate precautions during transfer.  Also just spoke with 2W charge RN here at [facility] and let her know about patient, highly suspected COVID with false negative abbot test, need for airborne precautions, etc.

## 2018-07-29 NOTE — H&P (Signed)
History and Physical    Tanya LeydenMatilde Sims GNF:[id number] DOB: 06/19/1960 DOA: 07/28/2018  PCP: Patient, No Pcp Per  Patient coming from: Home  I have personally briefly reviewed patient's [facility]  Chief Complaint: Cough fever/shortness of breath  HPI: Tanya Sims is a 58 y.o. female with medical history significant of GERD presented to [facility] with a complaint of cough, fever and shortness of breath that started about 8 days ago and has been getting worse so she came to the emergency department to seek further help.  She is Spanish-speaking so interpreter was used for the history.  According to patient, there are 4 adults and 2 children who are already tested positive for COVID-19 and some of the adults [facility].  She also has right upper quadrant abdominal pain since about the same time which is dull, 5 out of 10, nonradiating which aggravates with coughing and relieved with rest.  According to her, all the 6 family members also live in the same house that she does.  ED Course: Upon arrival to the ED early morning, she was hypoxic, tachycardic and tachypneic and also febrile with temperature well over 102 F.  COVID-19 inflammatory markers were elevated and procalcitonin were unremarkable.  CBC was unremarkable and so was CMP.  CT of the abdomen and pelvis was done due to pain which showed viral pneumonia picture in the lungs but no abdominal pathology.  She was tested negative for COVID-19 in the in-house test however she still remains to be with clinical high suspicion for COVID-19.  Hospital service was consulted to admit the patient for further management.  Review of Systems: As per HPI otherwise 10 point review of systems negative.    History reviewed. No pertinent past medical history.  History reviewed. No pertinent surgical history.   reports that she has never smoked. She has never used smokeless  tobacco. She reports that she does not drink alcohol or use drugs.  No Known Allergies  No family history on file.  Prior to Admission medications   Medication Sig Start Date End Date Taking? Authorizing Provider  acetaminophen (TYLENOL) 500 MG tablet Take 1,000 mg by mouth every 6 (six) hours as needed for mild pain or fever.   Yes [provider]  omeprazole (PRILOSEC) 40 MG capsule Take 1 capsule (40 mg total) by mouth daily for 30 days. 07/25/18 08/24/18 Yes Hall-Potvin, [place], [place]    Physical Exam: Vitals:   07/29/18 0645 07/29/18 0700 07/29/18 0730 07/29/18 0906  BP:  95/60 (!) 97/54   Pulse: 80 71 75 85  Resp: 20 19 20 19   Temp:    98.5 F (36.9 C)  TempSrc:    Oral  SpO2: 97% 94% 95% 94%  Weight:      Height:        Constitutional: NAD, calm, comfortable Vitals:   07/29/18 0645 07/29/18 0700 07/29/18 0730 07/29/18 0906  BP:  95/60 (!) 97/54   Pulse: 80 71 75 85  Resp: 20 19 20 19   Temp:    98.5 F (36.9 C)  TempSrc:    Oral  SpO2: 97% 94% 95% 94%  Weight:      Height:       Eyes: PERRL, lids and conjunctivae normal ENMT: Mucous membranes are moist. Posterior pharynx clear of any exudate or lesions.Normal dentition.  Neck: normal, supple, no masses, no thyromegaly Respiratory: clear to auscultation bilaterally, no wheezing, no  crackles. Normal respiratory effort. No accessory muscle use.  Cardiovascular: Regular rate and rhythm, no murmurs / rubs / gallops. No extremity edema. 2+ pedal pulses. No carotid bruits.  Abdomen: no tenderness, no masses palpated. No hepatosplenomegaly. Bowel sounds positive.  Musculoskeletal: no clubbing / cyanosis. No joint deformity upper and lower extremities. Good ROM, no contractures. Normal muscle tone.  Skin: no rashes, lesions, ulcers. No induration Neurologic: CN 2-12 grossly intact. Sensation intact, DTR normal. Strength 5/5 in all 4.  Psychiatric: Normal judgment and insight. Alert and oriented x 3. Normal mood.     Labs on Admission: I have personally reviewed following labs and imaging studies  CBC: Recent Labs  Lab 07/28/18 2210  WBC 8.4  NEUTROABS 7.4  HGB 12.9  HCT 38.7  MCV 86.4  PLT 761   Basic Metabolic Panel: Recent Labs  Lab 07/28/18 2210  NA 130*  K 3.5  CL 99  CO2 20*  GLUCOSE 129*  BUN 8  CREATININE 0.63  CALCIUM 8.1*   GFR: Estimated Creatinine Clearance: 75.4 mL/min (by C-G formula based on SCr of 0.63 mg/dL). Liver Function Tests: Recent Labs  Lab 07/28/18 2210  AST 55*  ALT 65*  ALKPHOS 95  BILITOT 0.7  PROT 7.3  ALBUMIN 3.4*   Recent Labs  Lab 07/28/18 2210  LIPASE 54*   No results for input(s): AMMONIA in the last 168 hours. Coagulation Profile: No results for input(s): INR, PROTIME in the last 168 hours. Cardiac Enzymes: No results for input(s): CKTOTAL, CKMB, CKMBINDEX, TROPONINI in the last 168 hours. BNP (last 3 results) No results for input(s): PROBNP in the last 8760 hours. HbA1C: No results for input(s): HGBA1C in the last 72 hours. CBG: No results for input(s): GLUCAP in the last 168 hours. Lipid Profile: Recent Labs    07/28/18 2210  TRIG 107   Thyroid Function Tests: No results for input(s): TSH, T4TOTAL, FREET4, T3FREE, THYROIDAB in the last 72 hours. Anemia Panel: Recent Labs    07/28/18 2210  FERRITIN 710*   Urine analysis: No results found for: COLORURINE, APPEARANCEUR, LABSPEC, PHURINE, GLUCOSEU, HGBUR, BILIRUBINUR, KETONESUR, PROTEINUR, UROBILINOGEN, NITRITE, LEUKOCYTESUR  Radiological Exams on Admission: Ct Abdomen Pelvis W Contrast  Result Date: 07/29/2018 CLINICAL DATA:  58 year old female with right upper quadrant abdominal pain. Strong clinical suspicion for COVID-19. EXAM: CT ABDOMEN AND PELVIS WITH CONTRAST TECHNIQUE: Multidetector CT imaging of the abdomen and pelvis was performed using the standard protocol following bolus administration of intravenous contrast. CONTRAST:  1102mL OMNIPAQUE IOHEXOL 300 MG/ML   SOLN COMPARISON:  None. FINDINGS: Lower chest: Bilateral patchy lower lobe consolidative changes as well as scattered bilateral confluent hazy densities noted. There are a spectrum of findings in the lungs which can be seen with acute atypical infection (as well as other non-infectious etiologies). In particular, viral pneumonia (including COVID-19) should be considered in the appropriate clinical setting. No intra-abdominal free air or free fluid. Hepatobiliary: Probable mild fatty liver. No intrahepatic biliary ductal dilatation. No calcified gallstone. Possible mild adenomyomatosis of the gallbladder fundus. Pancreas: Unremarkable. No pancreatic ductal dilatation or surrounding inflammatory changes. Spleen: Normal in size without focal abnormality. Adrenals/Urinary Tract: The adrenal glands, kidneys, and visualized ureters and urinary bladder appear unremarkable. Stomach/Bowel: There is no bowel obstruction or active inflammation. Normal appendix. Vascular/Lymphatic: No significant vascular findings are present. No enlarged abdominal or pelvic lymph nodes. Reproductive: The uterus is anteverted and grossly unremarkable. There is a 4 cm left ovarian/paraovarian cyst. Ultrasound is recommended for better evaluation on a  nonemergent basis. The right ovary is unremarkable. Other: Small fat containing umbilical hernia. Musculoskeletal: Degenerative changes at L5-S1 disc. No acute osseous pathology. IMPRESSION: 1. No acute intra-abdominal or pelvic pathology. 2. Bilateral pulmonary densities likely combination of atelectasis and atypical infection. Please see discussion above. 3. A 4 cm left ovarian/paraovarian cyst. Electronically Signed   By: Elgie CollardArash  Radparvar M.D.   On: 07/29/2018 02:00   Dg Chest Port 1 View  Result Date: 07/28/2018 CLINICAL DATA:  Cough, fever, nausea, vomiting, and worsening chest pain for 8 days, has been in close contact with a COVID-19 positive individual EXAM: PORTABLE CHEST 1 VIEW  COMPARISON:  Portable exam 2218 hours without priors for comparison FINDINGS: Normal heart size, mediastinal contours and pulmonary vascularity. Hazy infiltrate RIGHT mid lung. Additional infiltrate versus atelectasis at LEFT base. Upper lungs clear. No pleural effusion or pneumothorax. Bones demineralized. IMPRESSION: Hazy infiltrate RIGHT mid lung with more prominent opacity at LEFT base which could represent infiltrate or atelectasis. Electronically Signed   By: Ulyses SouthwardMark  Boles M.D.   On: 07/28/2018 23:07    EKG: Independently reviewed.  Sinus tachycardia with no acute ST-T wave changes.  Assessment/Plan Active Problems:   Suspected Covid-19 Virus Infection   Acute respiratory failure with hypoxia (HCC)   Close Exposure to Covid-19 Virus   GERD (gastroesophageal reflux disease)   Right upper quadrant pain    Sepsis secondary to viral pneumonia/highly suspected COVID-19 infection due to very close exposure to COVID-19 positive patients/acute hypoxic respiratory failure: She meets sepsis criteria based on tachycardia, high-grade fever and tachypnea.  We will treat her symptomatically at this point in time along with Solu-Medrol 60 mg twice daily.  We are going to test her again for COVID-19, this time with send out test.  Since she does not have evidence of positive COVID-19 test so unfortunately she will not qualify for remdesvir.  Repeat most of the labs tomorrow morning.  Avoid excessive hydration.  GERD: Continue omeprazole.  Right upper quadrant/right lower rib pain: CT of the been negative for any acute abdominal pathology.  Her pain could very well be due to costochondritis and excessive coughing.  DVT prophylaxis: Lovenox Code Status: Full code Family Communication: None present at bedside.  Discussed with patient. Disposition Plan: Most likely discharge back to home pending clinical course. Consults called: None Admission status: Inpatient   Hughie Clossavi Joliene Salvador MD [facility]  Pager [phone]  If 7PM-7AM, please contact night-coverage [email or web address] Password [email or web address]  07/29/2018, 9:56 AM

## 2018-07-29 NOTE — Progress Notes (Signed)
Pharmacy Brief Note  O:  ALT: 65 CXR: consistent with infiltrate vs atelectasis SpO2: 92% on 8L HFNC  A/P:  Patient meets criteria for remdesevir therapy. Will initiate remdesivir 200 mg iv once followed by 100 mg iv daily x 4 days. Will f/u ALT.   Napoleon Form, [facility] 07/29/18 3:31 PM

## 2018-07-29 NOTE — ED Notes (Signed)
In to re-assess pt. Using the spanish language line. Pt states general pain has improved. Still with that right upper quad pain. Rates 5/10. Denies sob. resp even and unlabored. Pt aware we are waiting on transport to [facility].

## 2018-07-29 NOTE — Progress Notes (Addendum)
7494 Pt arrived via [facility] to [facility] room 1222. Pt is Spanish speaking but [facility] EMT able to translate. Pt c/o pain in RUQ, SOB & cough on exertion. RN set up video translator to access patient, review POC, address concerns, and orient to room. RN paged MD and awaiting new orders. Pt resting comfortably in bed on 8L HFNC. VSS. NAD, safety bundle intact, WCTM.   0930 MD at bedside, video translator in use. Updated with POC, new meds, and admission data base completed with patient through translator. WCTM  1100 RN called patient's dgt Steph Cheadle at [phone] and provided an update. All questions answered, mother's room phone number provided to dgt. Will call if any changes in status.   78 RN in room to obtain vital signs, video interpreter used. Pt assisted up to [place], desaturated on 8L HFNC to 79%, RN encouraged patient to take deep breaths and O2 sat back up in low 90s. Pt also reminded to lay on stomach intermittently throughout day. Updated with POC, RSV swab collected and sent to lab. Awaiting test results. Safety intact, WCTM.   [place] results called to RN, MD paged, new orders received.   1430 RN at bedside with video interpreter, video interpreter called MD and MD on speaker phone. Updating patient with test results, new meds, possible blood transfusion, and transfer to [facility]. All questions answered. Pt resting, denies pain and SOB. WCTM.  1445 Report called to Jenny Reichmann, Therapist, sports at [facility].   1449 Attempted to call [facility] X2 for transport. No answer.  1500 [facility] here to pick up patient. Vital signs stable, tocilizumab infusing. All belongings sent with patient including purse, cell phone, and clothing. Daughter Kathlee Nations called and updated with transfer. Dgt Kathlee Nations with multiple questions. RN called Spanish interpreter to assist with language barrier.

## 2018-07-30 ENCOUNTER — Inpatient Hospital Stay (HOSPITAL_COMMUNITY): Payer: HRSA Program

## 2018-07-30 LAB — COMPREHENSIVE METABOLIC PANEL
ALT: 68 U/L — ABNORMAL HIGH (ref 0–44)
AST: 53 U/L — ABNORMAL HIGH (ref 15–41)
Albumin: 3.2 g/dL — ABNORMAL LOW (ref 3.5–5.0)
Alkaline Phosphatase: 89 U/L (ref 38–126)
Anion gap: 10 (ref 5–15)
BUN: 15 mg/dL (ref 6–20)
CO2: 21 mmol/L — ABNORMAL LOW (ref 22–32)
Calcium: 8.5 mg/dL — ABNORMAL LOW (ref 8.9–10.3)
Chloride: 104 mmol/L (ref 98–111)
Creatinine, Ser: 0.62 mg/dL (ref 0.44–1.00)
GFR calc Af Amer: >60 mL/min (ref >60)
GFR calc non Af Amer: >60 mL/min (ref >60)
Glucose, Bld: 159 mg/dL — ABNORMAL HIGH (ref 70–99)
Potassium: 3.7 mmol/L (ref 3.5–5.1)
Sodium: 135 mmol/L (ref 135–145)
Total Bilirubin: 0.6 mg/dL (ref 0.3–1.2)
Total Protein: 7.4 g/dL (ref 6.5–8.1)

## 2018-07-30 LAB — CBC WITH DIFFERENTIAL/PLATELET
Abs Immature Granulocytes: 0.06 10*3/uL (ref 0.00–0.07)
Basophils Absolute: 0 10*3/uL (ref 0.0–0.1)
Basophils Relative: 0 %
Eosinophils Absolute: 0 10*3/uL (ref 0.0–0.5)
Eosinophils Relative: 0 %
HCT: 38.5 % (ref 36.0–46.0)
Hemoglobin: 12.3 g/dL (ref 12.0–15.0)
Immature Granulocytes: 1 %
Lymphocytes Relative: 7 %
Lymphs Abs: 0.5 10*3/uL — ABNORMAL LOW (ref 0.7–4.0)
MCH: 28.1 pg (ref 26.0–34.0)
MCHC: 31.9 g/dL (ref 30.0–36.0)
MCV: 88.1 fL (ref 80.0–100.0)
Monocytes Absolute: 0.1 10*3/uL (ref 0.1–1.0)
Monocytes Relative: 1 %
Neutro Abs: 6.6 10*3/uL (ref 1.7–7.7)
Neutrophils Relative %: 91 %
Platelets: 329 10*3/uL (ref 150–400)
RBC: 4.37 MIL/uL (ref 3.87–5.11)
RDW: 13.5 % (ref 11.5–15.5)
WBC: 7.2 10*3/uL (ref 4.0–10.5)
nRBC: 0 % (ref 0.0–0.2)

## 2018-07-30 LAB — D-DIMER, QUANTITATIVE: D-Dimer, Quant: 0.66 ug/mL-FEU — ABNORMAL HIGH (ref 0.00–0.50)

## 2018-07-30 LAB — MAGNESIUM: Magnesium: 2.5 mg/dL — ABNORMAL HIGH (ref 1.7–2.4)

## 2018-07-30 LAB — TYPE AND SCREEN
ABO/RH(D): A POS
Antibody Screen: NEGATIVE

## 2018-07-30 LAB — FERRITIN: Ferritin: 853 ng/mL — ABNORMAL HIGH (ref 11–307)

## 2018-07-30 LAB — LACTATE DEHYDROGENASE: LDH: 239 U/L — ABNORMAL HIGH (ref 98–192)

## 2018-07-30 LAB — HIV ANTIBODY (ROUTINE TESTING W REFLEX): HIV Screen 4th Generation wRfx: NONREACTIVE

## 2018-07-30 LAB — ABO/RH: ABO/RH(D): A POS

## 2018-07-30 LAB — C-REACTIVE PROTEIN: CRP: 13.3 mg/dL — ABNORMAL HIGH (ref <1.0)

## 2018-07-30 LAB — BRAIN NATRIURETIC PEPTIDE: B Natriuretic Peptide: 33.8 pg/mL (ref 0.0–100.0)

## 2018-07-30 MED ORDER — ENSURE ENLIVE PO LIQD
237.0000 mL | Freq: Two times a day (BID) | ORAL | Status: DC
  Administered 2018-07-30 – 2018-08-04 (×10): 237 mL via ORAL

## 2018-07-30 MED ORDER — HYDROCOD POLST-CPM POLST ER 10-8 MG/5ML PO SUER
5.0000 mL | Freq: Two times a day (BID) | ORAL | Status: DC
  Administered 2018-07-30 – 2018-08-04 (×11): 5 mL via ORAL
  Filled 2018-07-30 (×11): qty 5

## 2018-07-30 MED ORDER — ENOXAPARIN SODIUM 40 MG/0.4ML ~~LOC~~ SOLN
40.0000 mg | SUBCUTANEOUS | Status: DC
  Administered 2018-07-30 – 2018-08-04 (×7): 40 mg via SUBCUTANEOUS
  Filled 2018-07-30 (×7): qty 0.4

## 2018-07-30 NOTE — Progress Notes (Signed)
PROGRESS NOTE                                                                                                                                                                                                             Patient Demographics:    Tanya Sims, is a 58 y.o. female, DOB - 03/05/1960, OFB:[id number]  Outpatient Primary MD for the patient is Patient, No Pcp Per    LOS - 1  Admit date - 07/28/2018    Chief Complaint  Patient presents with   Fever       Brief Narrative  Tanya Sims is a 58 y.o. female with medical history significant of GERD presented to [facility] with a complaint of cough, fever and shortness of breath that started about 8 days ago and has been getting worse, she was diagnosed with acute hypoxic respiratory failure due to COVID-19 pneumonitis and admitted.   Subjective:    Tanya Sims today has, No headache, No chest pain, No abdominal pain - No Nausea, No new weakness tingling or numbness, much better SOB.     Assessment  & Plan :     1. Acute Hypoxic Resp. Failure due to Acute Covid 19 Viral Pneumonitis during the ongoing 2020 Covid 19 Pandemic - she had severe hypoxia requiring 8 L high flow nasal cannula oxygen, she was started on IV steroids, REMDESIVIR and Actemra, inflammatory markers have stabilized and she has clinically shown improvement, hypoxia is much better.  Will encourage her to sit up in chair and use flutter valve for pulmonary toiletry in the daytime and prone at night.  Continue to monitor closely.  She is already consented for Actemra use and plasma use if needed.  This was confirmed by me via interpreter yesterday while she was in [facility] over the phone.  COVID-19 Labs  Recent Labs    07/28/18 2210 07/30/18 0445  DDIMER 0.83* 0.66*  FERRITIN 710* 853*  LDH 247* 239*  CRP 13.1* 13.3*    Lab Results  Component Value Date   SARSCOV2NAA POSITIVE (A) 07/29/2018   SARSCOV2NAA NEGATIVE 07/28/2018     Hepatic Function Latest Ref Rng & Units 07/30/2018 07/28/2018 04/02/2009  Total Protein 6.5 - 8.1 g/dL 7.4 7.3 7.6  Albumin 3.5 - 5.0 g/dL 3.2(L) 3.4(L) 4.4  AST 15 - 41 U/L  53(H) 55(H) 16  ALT 0 - 44 U/L 68(H) 65(H) 12  Alk Phosphatase 38 - 126 U/L 89 95 60  Total Bilirubin 0.3 - 1.2 mg/dL 0.6 0.7 1.5(H)        Component Value Date/Time   BNP 33.8 07/30/2018 0445      2.  GERD.  On PPI.  3.  Musculoskeletal epigastric abdominal pain due to persistent cough.  Much improved.  No right upper quadrant tenderness, LFTs relatively stable when accounted for COVID-19 infection and Actemra use.  CT scan nonacute.  Managed with supportive care for cough and pain.     Condition - Extremely Guarded  Family Communication  : None  Code Status :  Full  Diet :   Diet Order            Diet regular Room service appropriate? Yes; Fluid consistency: Thin  Diet effective now               Disposition Plan  :  Tele/PCU  Consults  :  None  Procedures  :    CT Abd - non acute  PUD Prophylaxis : PPI  DVT Prophylaxis  :  Lovenox ordered  Lab Results  Component Value Date   PLT 329 07/30/2018    Inpatient Medications  Scheduled Meds:  albuterol  2 puff Inhalation Q6H   Chlorhexidine Gluconate Cloth  6 each Topical Daily   enoxaparin (LOVENOX) injection  40 mg Subcutaneous Q24H   feeding supplement (ENSURE ENLIVE)  237 mL Oral BID BM   mouth rinse  15 mL Mouth Rinse BID   methylPREDNISolone (SOLU-MEDROL) injection  60 mg Intravenous Q12H   pantoprazole  40 mg Oral Daily   sodium chloride flush  3 mL Intravenous Q12H   Continuous Infusions:  lactated ringers 75 mL/hr at 07/29/18 1653   remdesivir 100 mg in NS 250 mL     PRN Meds:.acetaminophen, bisacodyl, ondansetron **OR** ondansetron (ZOFRAN) IV, senna-docusate  Antibiotics  :    Anti-infectives (From admission, onward)   Start      Dose/Rate Route Frequency Ordered Stop   07/30/18 1630  remdesivir 100 mg in sodium chloride 0.9 % 250 mL IVPB     100 mg 500 mL/hr over 30 Minutes Intravenous Every 24 hours 07/29/18 1530 08/03/18 1629   07/29/18 1630  remdesivir 200 mg in sodium chloride 0.9 % 250 mL IVPB     200 mg 500 mL/hr over 30 Minutes Intravenous Once 07/29/18 1530 07/29/18 1726       Time Spent in minutes  Ravenden M.D on 07/30/2018 at 11:05 AM  To page go to [email or web address] - password [email or web address]  [facility] -  Office  [phone]  See all Orders from today for further details    Objective:   Vitals:   07/29/18 1931 07/30/18 0427 07/30/18 0800 07/30/18 0915  BP: 121/61 109/72 (!) 99/59   Pulse: (!) 107 78 70   Resp: (!) 24 (!) 22 18   Temp: 98.8 F (37.1 C) 97.9 F (36.6 C)  98.6 F (37 C)  TempSrc: Axillary Oral  Oral  SpO2: 90% 92% 94%   Weight:      Height:        Wt Readings from Last 3 Encounters:  07/29/18 75.7 kg     Intake/Output Summary (Last 24 hours) at 07/30/2018 1105 Last data filed at 07/29/2018 1656 Gross per 24 hour  Intake 262.87 ml  Output 350 ml  Net -87.13  ml     Physical Exam  Awake Alert, No new F.N deficits, Normal affect [place].AT,PERRAL Supple Neck,No JVD, No cervical lymphadenopathy appriciated.  Symmetrical Chest wall movement, Good air movement bilaterally, CTAB RRR,No Gallops, Rubs or new Murmurs, No Parasternal Heave +ve B.Sounds, Abd Soft, No tenderness, No organomegaly appriciated, No rebound - guarding or rigidity. No Cyanosis, Clubbing or edema, No new Rash or bruise       Data Review:    CBC Recent Labs  Lab 07/28/18 2210 07/29/18 1008 07/30/18 0445  WBC 8.4 10.5 7.2  HGB 12.9 12.7 12.3  HCT 38.7 40.2 38.5  PLT 280 322 329  MCV 86.4 88.0 88.1  MCH 28.8 27.8 28.1  MCHC 33.3 31.6 31.9  RDW 13.2 13.5 13.5  LYMPHSABS 0.7  --  0.5*  MONOABS 0.3  --  0.1  EOSABS 0.0  --  0.0  BASOSABS 0.0  --  0.0    Chemistries  Recent  Labs  Lab 07/28/18 2210 07/29/18 1008 07/30/18 0445  NA 130*  --  135  K 3.5  --  3.7  CL 99  --  104  CO2 20*  --  21*  GLUCOSE 129*  --  159*  BUN 8  --  15  CREATININE 0.63 0.79 0.62  CALCIUM 8.1*  --  8.5*  MG  --   --  2.5*  AST 55*  --  53*  ALT 65*  --  68*  ALKPHOS 95  --  89  BILITOT 0.7  --  0.6   ------------------------------------------------------------------------------------------------------------------ Recent Labs    07/28/18 2210  TRIG 107    No results found for: HGBA1C ------------------------------------------------------------------------------------------------------------------ No results for input(s): TSH, T4TOTAL, T3FREE, THYROIDAB in the last 72 hours.  Invalid input(s): FREET3  Cardiac Enzymes No results for input(s): CKMB, TROPONINI, MYOGLOBIN in the last 168 hours.  Invalid input(s): CK ------------------------------------------------------------------------------------------------------------------    Component Value Date/Time   BNP 33.8 07/30/2018 0445    Micro Results Recent Results (from the past 240 hour(s))  Culture, blood (routine x 2)     Status: None (Preliminary result)   Collection Time: 07/28/18 10:00 PM   Specimen: BLOOD LEFT ARM  Result Value Ref Range Status   Specimen Description   Final    BLOOD LEFT ARM Performed at [facility], [address] [place], [place] [address]    Special Requests   Final    BOTTLES DRAWN AEROBIC AND ANAEROBIC Blood Culture adequate volume Performed at [facility], [address] [place], [place] [address]    Culture   Final    NO GROWTH < 24 HOURS Performed at [facility], [place] [address], [place], [place] [address]    Report Status PENDING  Incomplete  Culture, blood (routine x 2)     Status: None (Preliminary result)   Collection Time: 07/28/18 10:10 PM   Specimen: BLOOD RIGHT ARM  Result Value Ref Range Status   Specimen Description   Final     BLOOD RIGHT ARM Performed at [facility], [address] [place], [place] [address]    Special Requests   Final    BOTTLES DRAWN AEROBIC AND ANAEROBIC Blood Culture adequate volume   Culture   Final    NO GROWTH < 24 HOURS Performed at [facility], [place] [address], [place], [place] [address]    Report Status PENDING  Incomplete  SARS Coronavirus 2 (Hosp order,Performed  in [place] lab via [employer] ID)     Status: None   Collection Time: 07/28/18 11:00 PM   Specimen: Dry Nasal Swab (Abbott ID Now)  Result Value Ref Range Status   SARS Coronavirus 2 (Abbott ID Now) NEGATIVE NEGATIVE Final    Comment: (NOTE) Interpretive Result Comment(s): COVID 19 Positive SARS CoV 2 target nucleic acids are DETECTED. The SARS CoV 2 RNA is generally detectable in upper and lower respiratory specimens during the acute phase of infection.  Positive results are indicative of active infection with SARS CoV 2.  Clinical correlation with patient history and other diagnostic information is necessary to determine patient infection status.  Positive results do not rule out bacterial infection or coinfection with other viruses. The expected result is Negative. COVID 19 Negative SARS CoV 2 target nucleic acids are NOT DETECTED. The SARS CoV 2 RNA is generally detectable in upper and lower respiratory specimens during the acute phase of infection.  Negative results do not preclude SARS CoV 2 infection, do not rule out coinfections with other pathogens, and should not be used as the sole basis for treatment or other patient management decisions.  Negative results must be combined with clinical  observations, patient history, and epidemiological information. The expected result is Negative. Invalid Presence or absence of SARS CoV 2 nucleic acids cannot be determined. Repeat testing was performed on the submitted specimen and repeated Invalid results were obtained.  If clinically  indicated, additional testing on a new specimen with an alternate test methodology [phone]) is advised.  The SARS CoV 2 RNA is generally detectable in upper and lower respiratory specimens during the acute phase of infection. The expected result is Negative. Fact Sheet for Patients:  GolfingFamily.no Fact Sheet for Healthcare Providers: https://www.hernandez-brewer.com/ This test is not yet approved or cleared by the [place] FDA and has been authorized for detection and/or diagnosis of SARS CoV 2 by FDA under an Emergency Use Authorization (EUA).  This EUA will remain in effect (meaning this test can be used) for the duration of the COVID19 d eclaration under Section 564(b)(1) of the Act, 21 U.S.C. section [phone] 3(b)(1), unless the authorization is terminated or revoked sooner. Performed at [facility], [address] [place], [place] [address]   SARS Coronavirus 2 (CEPHEID - Performed in [facility] hospital lab), Hosp Order     Status: Abnormal   Collection Time: 07/29/18  9:29 AM   Specimen: Nasal Mucosa; Nasopharyngeal  Result Value Ref Range Status   SARS Coronavirus 2 POSITIVE (A) NEGATIVE Final    Comment: RESULT CALLED TO, READ BACK BY AND VERIFIED WITH: S.Rosine Abe [id number] BY V.WILKINS (NOTE) If result is NEGATIVE SARS-CoV-2 target nucleic acids are NOT DETECTED. The SARS-CoV-2 RNA is generally detectable in upper and lower  respiratory specimens during the acute phase of infection. The lowest  concentration of SARS-CoV-2 viral copies this assay can detect is 250  copies / mL. A negative result does not preclude SARS-CoV-2 infection  and should not be used as the sole basis for treatment or other  patient management decisions.  A negative result may occur with  improper specimen collection / handling, submission of specimen other  than nasopharyngeal swab, presence of viral mutation(s) within the  areas targeted by  this assay, and inadequate number of viral copies  (<250 copies / mL). A negative result must be combined with clinical  observations, patient history, and epidemiological information. If result is POSITIVE SARS-CoV-2 target nucleic acids  are DETECTED. The  SARS-CoV-2 RNA is generally detectable in upper and lower  respiratory specimens during the acute phase of infection.  Positive  results are indicative of active infection with SARS-CoV-2.  Clinical  correlation with patient history and other diagnostic information is  necessary to determine patient infection status.  Positive results do  not rule out bacterial infection or co-infection with other viruses. If result is PRESUMPTIVE POSTIVE SARS-CoV-2 nucleic acids MAY BE PRESENT.   A presumptive positive result was obtained on the submitted specimen  and confirmed on repeat testing.  While 2019 novel coronavirus  (SARS-CoV-2) nucleic acids may be present in the submitted sample  additional confirmatory testing may be necessary for epidemiological  and / or clinical management purposes  to differentiate between  SARS-CoV-2 and other Sarbecovirus currently known to infect humans.  If clinically indicated additional testing with an alternate test  methodology [phone]) is a dvised. The SARS-CoV-2 RNA is generally  detectable in upper and lower respiratory specimens during the acute  phase of infection. The expected result is Negative. Fact Sheet for Patients:  StrictlyIdeas.no Fact Sheet for Healthcare Providers: BankingDealers.co.za This test is not yet approved or cleared by the [place] FDA and has been authorized for detection and/or diagnosis of SARS-CoV-2 by FDA under an Emergency Use Authorization (EUA).  This EUA will remain in effect (meaning this test can be used) for the duration of the COVID-19 declaration under Section 564(b)(1) of the Act, 21 U.S.C. section  360bbb-3(b)(1), unless the authorization is terminated or revoked sooner. Performed at [facility], [place] [address], [place], [place] [address]   MRSA PCR Screening     Status: None   Collection Time: 07/29/18  9:29 AM   Specimen: Nasal Mucosa; Nasopharyngeal  Result Value Ref Range Status   MRSA by PCR NEGATIVE NEGATIVE Final    Comment:        The GeneXpert MRSA Assay (FDA approved for NASAL specimens only), is one component of a comprehensive MRSA colonization surveillance program. It is not intended to diagnose MRSA infection nor to guide or monitor treatment for MRSA infections. Performed at [facility], [place] [address], [place], [place] [address]   Respiratory Panel by PCR     Status: None   Collection Time: 07/29/18 12:14 PM   Specimen: Nasopharyngeal Swab; Respiratory  Result Value Ref Range Status   Adenovirus NOT DETECTED NOT DETECTED Final   Coronavirus 229E NOT DETECTED NOT DETECTED Final    Comment: (NOTE) The Coronavirus on the Respiratory Panel, DOES NOT test for the novel  Coronavirus (2019 nCoV)    Coronavirus HKU1 NOT DETECTED NOT DETECTED Final   Coronavirus NL63 NOT DETECTED NOT DETECTED Final   Coronavirus OC43 NOT DETECTED NOT DETECTED Final   Metapneumovirus NOT DETECTED NOT DETECTED Final   Rhinovirus / Enterovirus NOT DETECTED NOT DETECTED Final   Influenza A NOT DETECTED NOT DETECTED Final   Influenza B NOT DETECTED NOT DETECTED Final   Parainfluenza Virus 1 NOT DETECTED NOT DETECTED Final   Parainfluenza Virus 2 NOT DETECTED NOT DETECTED Final   Parainfluenza Virus 3 NOT DETECTED NOT DETECTED Final   Parainfluenza Virus 4 NOT DETECTED NOT DETECTED Final   Respiratory Syncytial Virus NOT DETECTED NOT DETECTED Final   Bordetella pertussis NOT DETECTED NOT DETECTED Final   Chlamydophila pneumoniae NOT DETECTED NOT DETECTED Final   Mycoplasma pneumoniae NOT DETECTED NOT DETECTED Final    Comment: Performed at  [facility], [address] [place], [place] [address]    Radiology Reports Ct Abdomen Pelvis W Contrast  Result Date: 07/29/2018 CLINICAL DATA:  58 year old female with right upper quadrant abdominal pain. Strong clinical suspicion for COVID-19. EXAM: CT ABDOMEN AND PELVIS WITH CONTRAST TECHNIQUE: Multidetector CT imaging of the abdomen and pelvis was performed using the standard protocol following bolus administration of intravenous contrast. CONTRAST:  155m OMNIPAQUE IOHEXOL 300 MG/ML  SOLN COMPARISON:  None. FINDINGS: Lower chest: Bilateral patchy lower lobe consolidative changes as well as scattered bilateral confluent hazy densities noted. There are a spectrum of findings in the lungs which can be seen with acute atypical infection (as well as other non-infectious etiologies). In particular, viral pneumonia (including COVID-19) should be considered in the appropriate clinical setting. No intra-abdominal free air or free fluid. Hepatobiliary: Probable mild fatty liver. No intrahepatic biliary ductal dilatation. No calcified gallstone. Possible mild adenomyomatosis of the gallbladder fundus. Pancreas: Unremarkable. No pancreatic ductal dilatation or surrounding inflammatory changes. Spleen: Normal in size without focal abnormality. Adrenals/Urinary Tract: The adrenal glands, kidneys, and visualized ureters and urinary bladder appear unremarkable. Stomach/Bowel: There is no bowel obstruction or active inflammation. Normal appendix. Vascular/Lymphatic: No significant vascular findings are present. No enlarged abdominal or pelvic lymph nodes. Reproductive: The uterus is anteverted and grossly unremarkable. There is a 4 cm left ovarian/paraovarian cyst. Ultrasound is recommended for better evaluation on a nonemergent basis. The right ovary is unremarkable. Other: Small fat containing umbilical hernia. Musculoskeletal: Degenerative changes at L5-S1 disc. No acute osseous pathology. IMPRESSION: 1. No  acute intra-abdominal or pelvic pathology. 2. Bilateral pulmonary densities likely combination of atelectasis and atypical infection. Please see discussion above. 3. A 4 cm left ovarian/paraovarian cyst. Electronically Signed   By: AAnner CreteM.D.   On: 07/29/2018 02:00   Dg Chest Port 1 View  Result Date: 07/30/2018 CLINICAL DATA:  Shortness of breath.  COVID-19. EXAM: PORTABLE CHEST 1 VIEW COMPARISON:  CT 07/29/2018.  Chest x-ray 07/28/2018. FINDINGS: Mediastinum hilar structures normal. Heart size stable. Persistent dense bibasilar atelectasis/infiltrates. Tiny bilateral pleural effusions cannot be excluded. No pneumothorax. IMPRESSION: Persistent dense bibasilar atelectasis/infiltrates. Tiny bilateral pleural effusions cannot be excluded. Similar findings noted on prior CT. Electronically Signed   By: TMarcello Moores Register   On: 07/30/2018 06:54   Dg Chest Port 1 View  Result Date: 07/28/2018 CLINICAL DATA:  Cough, fever, nausea, vomiting, and worsening chest pain for 8 days, has been in close contact with a COVID-19 positive individual EXAM: PORTABLE CHEST 1 VIEW COMPARISON:  Portable exam 2218 hours without priors for comparison FINDINGS: Normal heart size, mediastinal contours and pulmonary vascularity. Hazy infiltrate RIGHT mid lung. Additional infiltrate versus atelectasis at LEFT base. Upper lungs clear. No pleural effusion or pneumothorax. Bones demineralized. IMPRESSION: Hazy infiltrate RIGHT mid lung with more prominent opacity at LEFT base which could represent infiltrate or atelectasis. Electronically Signed   By: MLavonia DanaM.D.   On: 07/28/2018 23:07

## 2018-07-30 NOTE — Evaluation (Addendum)
Physical Therapy Evaluation Patient Details Name: Tanya Sims MRN: [id number] DOB: [birth year] Today's Date: 07/30/2018   History of Present Illness  58 yo female admitted 6/28/with worsening SOB, fever, cough, positive for covid, hypoxic respiratory distress.  Clinical Impression  The patient is on 10 L HFNC, SaO2 at rest 93%. Down to 81%  For transfer to [facility] and back, HR 87 up to 131 for the minimal activity. Patient's strength is WFL, limited by cardio-pulmonary issues.  Pt admitted with above diagnosis. Pt currently with functional limitations due to the deficits listed below (see PT Problem List).  Pt will benefit from skilled PT to increase their independence and safety with mobility to allow discharge to the venue listed below.       Follow Up Recommendations No PT follow up    Equipment Recommendations  None recommended by PT    Recommendations for Other Services       Precautions / Restrictions Precautions Precaution Comments: monitorVS, on HFNC      Mobility  Bed Mobility               General bed mobility comments: oob IN RECLINER  Transfers Overall transfer level: Needs assistance   Transfers: Sit to/from Stand;Stand Pivot Transfers Sit to Stand: Supervision Stand pivot transfers: Supervision       General transfer comment: no assist, steady,on feet, assist with lines  Ambulation/Gait             General Gait Details: NT due to hi HR and drop in sats  Stairs            Wheelchair Mobility    Modified Rankin (Stroke Patients Only)       Balance Overall balance assessment: No apparent balance deficits (not formally assessed)                                           Pertinent Vitals/Pain Pain Assessment: Faces Faces Pain Scale: Hurts little more Pain Location: right lower quadrant Pain Descriptors / Indicators: Aching Pain Intervention(s): Monitored during session;Premedicated before session    Home  Living Family/patient expects to be discharged to:: Private residence Living Arrangements: Spouse/significant other;Children;Other relatives;Non-relatives/Friends;Other (Comment) Available Help at Discharge: Family Type of Home: House Home Access: Level entry     Home Layout: One level Home Equipment: None      Prior Function Level of Independence: Independent               Hand Dominance        Extremity/Trunk Assessment   Upper Extremity Assessment Upper Extremity Assessment: Generalized weakness    Lower Extremity Assessment Lower Extremity Assessment: Generalized weakness    Cervical / Trunk Assessment Cervical / Trunk Assessment: Normal  Communication   Communication: Prefers language other than English  Cognition Arousal/Alertness: Awake/alert Behavior During Therapy: WFL for tasks assessed/performed Overall Cognitive Status: Within Functional Limits for tasks assessed                                 General Comments: HQIONG [id number]      General Comments      Exercises     Assessment/Plan    PT Assessment Patient needs continued PT services  PT Problem List Decreased activity tolerance;Cardiopulmonary status limiting activity;Pain       PT Treatment Interventions DME  instruction;Therapeutic activities;Gait training;Functional mobility training;Patient/family education    PT Goals (Current goals can be found in the Care Plan section)  Acute Rehab PT Goals Patient Stated Goal: to go home PT Goal Formulation: With patient Time For Goal Achievement: 08/13/18 Potential to Achieve Goals: Good    Frequency Min 3X/week   Barriers to discharge        Co-evaluation               AM-PAC PT "6 Clicks" Mobility  Outcome Measure Help needed turning from your back to your side while in a flat bed without using bedrails?: None Help needed moving from lying on your back to sitting on the side of a flat bed without using bedrails?:  None Help needed moving to and from a bed to a chair (including a wheelchair)?: None Help needed standing up from a chair using your arms (e.g., wheelchair or bedside chair)?: None Help needed to walk in hospital room?: A Little Help needed climbing 3-5 steps with a railing? : A Little 6 Click Score: 22    End of Session Equipment Utilized During Treatment: Oxygen Activity Tolerance: Treatment limited secondary to medical complications (Comment) Patient left: in chair Nurse Communication: Mobility status PT Visit Diagnosis: Difficulty in walking, not elsewhere classified (R26.2)    Time: 8119-14781423-1445 PT Time Calculation (min) (ACUTE ONLY): 22 min   Charges:   PT Evaluation $PT Eval Low Complexity: 1 Low          Blanchard KelchKaren Zanyla Klebba PT Acute Rehabilitation Services Pager [phone] Office [phone]   Rada HayHill, Tirrell Buchberger Elizabeth 07/30/2018, 3:09 PM

## 2018-07-30 NOTE — Progress Notes (Signed)
Updated patients daughter on patient status and plan of care. All questions answered at this time

## 2018-07-30 NOTE — Progress Notes (Signed)
Initial Nutrition Assessment  DOCUMENTATION CODES:   Not applicable  INTERVENTION:   Ensure Enlive po BID, each supplement provides 350 kcal and 20 grams of protein.  Pt receiving Hormel Shake daily with Breakfast which provides 520 kcals and 22 g of protein and Magic cup BID with lunch and dinner, each supplement provides 290 kcal and 9 grams of protein, automatically on meal trays to optimize nutritional intake.   NUTRITION DIAGNOSIS:   Increased nutrient needs related to acute illness(COVID) as evidenced by estimated needs.  GOAL:   Patient will meet greater than or equal to 90% of their needs  MONITOR:   PO intake, Supplement acceptance, Labs  REASON FOR ASSESSMENT:   Consult Assessment of nutrition requirement/status  ASSESSMENT:   58 yo Spanish speaking female admitted with COVID-19. Six family members (4 adults and 2 children) living in the same house have tested positive for COVID-19. PMH of GERD.  Unable to speak with patient over the phone. Per review of medical record, she has been consuming ~50% of meals since admission. Suspect intake has been poor recently, given COVID symptoms for 8 days PTA.   Labs reviewed. Medications reviewed and include Solumedrol.    NUTRITION - FOCUSED PHYSICAL EXAM:  unable to complete  Diet Order:   Diet Order            Diet regular Room service appropriate? Yes; Fluid consistency: Thin  Diet effective now              EDUCATION NEEDS:   No education needs have been identified at this time  Skin:  Skin Assessment: Reviewed RN Assessment  Last BM:  6/27  Height:   Ht Readings from Last 1 Encounters:  07/29/18 5' 3.78" (1.62 m)    Weight:   Wt Readings from Last 1 Encounters:  07/29/18 75.7 kg    Ideal Body Weight:  54.5 kg  BMI:  Body mass index is 28.84 kg/m.  Estimated Nutritional Needs:   Kcal:  1700-1900  Protein:  90-105 gm  Fluid:  >/= 1.7 L    Molli Barrows, RD, LDN, [place] Pager  [phone] After Hours Pager [phone]

## 2018-07-31 LAB — COMPREHENSIVE METABOLIC PANEL
ALT: 78 U/L — ABNORMAL HIGH (ref 0–44)
AST: 61 U/L — ABNORMAL HIGH (ref 15–41)
Albumin: 2.8 g/dL — ABNORMAL LOW (ref 3.5–5.0)
Alkaline Phosphatase: 90 U/L (ref 38–126)
Anion gap: 11 (ref 5–15)
BUN: 22 mg/dL — ABNORMAL HIGH (ref 6–20)
CO2: 22 mmol/L (ref 22–32)
Calcium: 8.5 mg/dL — ABNORMAL LOW (ref 8.9–10.3)
Chloride: 106 mmol/L (ref 98–111)
Creatinine, Ser: 0.67 mg/dL (ref 0.44–1.00)
GFR calc Af Amer: >60 mL/min (ref >60)
GFR calc non Af Amer: >60 mL/min (ref >60)
Glucose, Bld: 169 mg/dL — ABNORMAL HIGH (ref 70–99)
Potassium: 4.1 mmol/L (ref 3.5–5.1)
Sodium: 139 mmol/L (ref 135–145)
Total Bilirubin: 0.1 mg/dL — ABNORMAL LOW (ref 0.3–1.2)
Total Protein: 6.4 g/dL — ABNORMAL LOW (ref 6.5–8.1)

## 2018-07-31 LAB — NOVEL CORONAVIRUS, NAA (HOSP ORDER, SEND-OUT TO REF LAB; TAT 18-24 HRS): SARS-CoV-2, NAA: DETECTED — AB

## 2018-07-31 LAB — CBC WITH DIFFERENTIAL/PLATELET
Abs Immature Granulocytes: 0.12 10*3/uL — ABNORMAL HIGH (ref 0.00–0.07)
Basophils Absolute: 0 10*3/uL (ref 0.0–0.1)
Basophils Relative: 0 %
Eosinophils Absolute: 0 10*3/uL (ref 0.0–0.5)
Eosinophils Relative: 0 %
HCT: 36.1 % (ref 36.0–46.0)
Hemoglobin: 11.4 g/dL — ABNORMAL LOW (ref 12.0–15.0)
Immature Granulocytes: 1 %
Lymphocytes Relative: 5 %
Lymphs Abs: 0.7 10*3/uL (ref 0.7–4.0)
MCH: 28.3 pg (ref 26.0–34.0)
MCHC: 31.6 g/dL (ref 30.0–36.0)
MCV: 89.6 fL (ref 80.0–100.0)
Monocytes Absolute: 0.4 10*3/uL (ref 0.1–1.0)
Monocytes Relative: 3 %
Neutro Abs: 13.7 10*3/uL — ABNORMAL HIGH (ref 1.7–7.7)
Neutrophils Relative %: 91 %
Platelets: 422 10*3/uL — ABNORMAL HIGH (ref 150–400)
RBC: 4.03 MIL/uL (ref 3.87–5.11)
RDW: 13.9 % (ref 11.5–15.5)
WBC: 14.9 10*3/uL — ABNORMAL HIGH (ref 4.0–10.5)
nRBC: 0 % (ref 0.0–0.2)

## 2018-07-31 LAB — BRAIN NATRIURETIC PEPTIDE: B Natriuretic Peptide: 78.6 pg/mL (ref 0.0–100.0)

## 2018-07-31 LAB — MAGNESIUM: Magnesium: 2.3 mg/dL (ref 1.7–2.4)

## 2018-07-31 LAB — C-REACTIVE PROTEIN: CRP: 5.3 mg/dL — ABNORMAL HIGH (ref <1.0)

## 2018-07-31 LAB — LACTATE DEHYDROGENASE: LDH: 261 U/L — ABNORMAL HIGH (ref 98–192)

## 2018-07-31 LAB — D-DIMER, QUANTITATIVE: D-Dimer, Quant: 0.5 ug/mL-FEU (ref 0.00–0.50)

## 2018-07-31 LAB — FERRITIN: Ferritin: 905 ng/mL — ABNORMAL HIGH (ref 11–307)

## 2018-07-31 NOTE — Progress Notes (Signed)
Spoke with patient daughter Kathlee Nations and updated her on patient status and plan of care. All questions answered at this time

## 2018-07-31 NOTE — Progress Notes (Signed)
OT Evaluation  PTA, pt independent with ADL and mobility. Pt desats to 83 during ADL on 15L HFNC. HR 124. Pt states she feels dizzy when up and mobilizing, therefore requires minguard A for mobility. Educated pt on HEP with theraband to complete independently. Issued IS and educated on use. Pt able to pull @ 715ml. [employer] interpreter used during session. Do not anticipate pt will need follow up OT. Will follow acutely to facilitate safe DC  Home.     07/31/18 1000  OT Visit Information  Last OT Received On 07/31/18  Assistance Needed +1  History of Present Illness 58 yo female admitted 6/28/with worsening SOB, fever, cough, positive for covid, hypoxic respiratory distress.  Precautions  Precaution Comments monitorVS, on HFNC  Home Living  Family/patient expects to be discharged to: Private residence  Living Arrangements Spouse/significant other;Children;Other relatives;Non-relatives/Friends;Other (Comment)  Available Help at Discharge Family  Type of [place] Access Level entry  [place] One level  Bathroom Shower/Tub Tub/shower unit  Corporate treasurer Yes  How Accessible Accessible via walker  [place] - single point  Prior Function  Level of Independence Independent  Communication  Communication Prefers language other than English (Spainish)  Pain Assessment  Pain Assessment 0-10  Pain Score 3  Pain Location abdomen/head  Pain Descriptors / Indicators Aching;Discomfort  Pain Intervention(s) Limited activity within patient's tolerance  Cognition  Arousal/Alertness Awake/alert  Behavior During Therapy Anxious  Overall Cognitive Status Within Functional Limits for tasks assessed  Upper Extremity Assessment  Upper Extremity Assessment Generalized weakness  Lower Extremity Assessment  Lower Extremity Assessment Defer to PT evaluation  Cervical / Trunk Assessment  Cervical / Trunk Assessment Normal  ADL  Overall ADL's   Needs assistance/impaired  Grooming Set up;Supervision/safety;Sitting  Upper Body Bathing Set up;Supervision/ safety;Sitting  Lower Body Bathing Set up;Supervison/ safety;Sit to/from stand  Upper Body Dressing  Set up;Supervision/safety;Sitting  Lower Body Dressing Set up;Supervision/safety;Sit to/from Technical sales engineer- Technical sales engineer;Set up;Sit to/from stand  Functional mobility during ADLs Supervision/safety  General ADL Comments Easily fatigues with ADL adn desats to 83; would benefit form energy conservation  Bed Mobility  General bed mobility comments OOB in recliner  Transfers  Overall transfer level Needs assistance  Transfers Sit to/from Stand;Stand Pivot Transfers  Sit to Stand Supervision  Stand pivot transfers Supervision  General transfer comment complains of being dizzy  Balance  Overall balance assessment No apparent balance deficits (not formally assessed)  Exercises  Exercises Other exercises  Other Exercises  Other Exercises level 1 theraband PNF diagonals x 10 reps each arm  Other Exercises elbow flex/ext x 10 theraband level 1x 10 reps each arm  Other Exercises B shoulder Abduction x 10 therabnad level 1  OT - End of Session  Equipment Utilized During Treatment Oxygen (15L)  Activity Tolerance Patient tolerated treatment well  Patient left in chair;with call bell/phone within reach  Nurse Communication Mobility status  OT Assessment  OT Recommendation/Assessment Patient needs continued OT Services  OT Visit Diagnosis Muscle weakness (generalized) (M62.81)  OT Problem List Decreased activity tolerance;Decreased knowledge of use of DME or AE;Cardiopulmonary status limiting activity;Pain  OT Plan  OT Frequency (ACUTE ONLY) Min 3X/week  OT Treatment/Interventions (ACUTE ONLY) Self-care/ADL training;Therapeutic exercise;Neuromuscular education;Energy conservation;DME and/or AE  instruction;Therapeutic activities;Patient/family education  AM-PAC OT "6 Clicks" Daily Activity Outcome Measure (Version 2)  Help from another person eating meals? 4  Help from another person taking  care of personal grooming? 3  Help from another person toileting, which includes using toliet, bedpan, or urinal? 3  Help from another person bathing (including washing, rinsing, drying)? 3  Help from another person to put on and taking off regular upper body clothing? 3  Help from another person to put on and taking off regular lower body clothing? 3  6 Click Score 19  OT Recommendation  Follow Up Recommendations No OT follow up;Supervision - Intermittent  OT Equipment 3 in 1 bedside commode (to use as shower seat)  Individuals Consulted  Consulted and Agree with Results and Recommendations Patient  Acute Rehab OT Goals  Patient Stated Goal to get better  OT Goal Formulation With patient  Time For Goal Achievement 08/14/18  Potential to Achieve Goals Good  OT Time Calculation  OT Start Time (ACUTE ONLY) 1005  OT Stop Time (ACUTE ONLY) 1048  OT Time Calculation (min) 43 min  OT General Charges  $OT Visit 1 Visit  OT Evaluation  $OT Eval Moderate Complexity 1 Mod  OT Treatments  $Self Care/Home Management  8-22 mins  $Therapeutic Exercise 8-22 mins  Written Expression  Dominant Hand Right  Luisa DagoHilary Myrakle Wingler, OT/L   Acute OT Clinical Specialist Acute Rehabilitation Services Pager [phone] Office [phone]

## 2018-07-31 NOTE — Progress Notes (Addendum)
PROGRESS NOTE                                                                                                                                                                                                             Patient Demographics:    Tanya Sims, is a 58 y.o. female, DOB - 1960/02/01, EUM:[id number]  Outpatient Primary MD for the patient is Patient, No Pcp Per    LOS - 2  Admit date - 07/28/2018    Chief Complaint  Patient presents with   Fever       Brief Narrative  Tanya Sims is a 58 y.o. female with medical history significant of GERD presented to [facility] with a complaint of cough, fever and shortness of breath that started about 8 days ago and has been getting worse, she was diagnosed with acute hypoxic respiratory failure due to COVID-19 pneumonitis and admitted.   Subjective:    Patient in bed, appears comfortable, denies any headache, no fever, no chest pain or pressure, no shortness of breath , no abdominal pain. No focal weakness.   Assessment  & Plan :     1. Acute Hypoxic Resp. Failure due to Acute Covid 19 Viral Pneumonitis during the ongoing 2020 Covid 19 Pandemic - she had severe hypoxia requiring 8 L high flow nasal cannula oxygen, she was started on IV steroids, REMDESIVIR and Actemra, inflammatory markers have stabilized and she has clinically shown improvement, hypoxia is much better, inflammatory markers are stabilizing.    Encouraged her to sit up in chair and daytime use flutter valve and I-S for pulmonary toiletry.  Gently advance activity, try to titrate the oxygen down if possible.  Updated daughter in detail on 07/31/2018 again.   Please note she has already consented for Actemra use and plasma use if needed.  This was confirmed by me via interpreter yesterday while she was in [facility] over the phone.  COVID-19 Labs  Recent Labs    07/28/18 2210  07/30/18 0445 07/31/18 0230  DDIMER 0.83* 0.66* 0.50  FERRITIN 710* 853* 905*  LDH 247* 239* 261*  CRP 13.1* 13.3* 5.3*  Lab Results  Component Value Date   SARSCOV2NAA POSITIVE (A) 07/29/2018   SARSCOV2NAA DETECTED (A) 07/29/2018   [place] NEGATIVE 07/28/2018     Hepatic Function Latest Ref Rng & Units 07/31/2018 07/30/2018 07/28/2018  Total Protein 6.5 - 8.1 g/dL 6.4(L) 7.4 7.3  Albumin 3.5 - 5.0 g/dL 2.8(L) 3.2(L) 3.4(L)  AST 15 - 41 U/L 61(H) 53(H) 55(H)  ALT 0 - 44 U/L 78(H) 68(H) 65(H)  Alk Phosphatase 38 - 126 U/L 90 89 95  Total Bilirubin 0.3 - 1.2 mg/dL 0.1(L) 0.6 0.7        Component Value Date/Time   BNP 78.6 07/31/2018 0230      2.  GERD.  On PPI.  3.  Musculoskeletal epigastric abdominal pain due to persistent cough.  Much improved.  No right upper quadrant tenderness, LFTs relatively stable when accounted for COVID-19 infection and Actemra use.  CT scan nonacute.  Managed with supportive care for cough and pain.     Condition - Extremely Guarded  Family Communication  : Daughter updated 07/31/2018.  Code Status :  Full  Diet :   Diet Order            Diet regular Room service appropriate? Yes; Fluid consistency: Thin  Diet effective now               Disposition Plan  :  Tele/PCU  Consults  :  None  Procedures  :    CT Abd - non acute  PUD Prophylaxis : PPI  DVT Prophylaxis  :  Lovenox ordered  Lab Results  Component Value Date   PLT 422 (H) 07/31/2018    Inpatient Medications  Scheduled Meds:  albuterol  2 puff Inhalation Q6H   Chlorhexidine Gluconate Cloth  6 each Topical Daily   chlorpheniramine-HYDROcodone  5 mL Oral Q12H   enoxaparin (LOVENOX) injection  40 mg Subcutaneous Q24H   feeding supplement (ENSURE ENLIVE)  237 mL Oral BID BM   mouth rinse  15 mL Mouth Rinse BID   methylPREDNISolone (SOLU-MEDROL) injection  60 mg Intravenous Q12H   pantoprazole  40 mg Oral Daily   sodium chloride flush  3 mL  Intravenous Q12H   Continuous Infusions:  remdesivir 100 mg in NS 250 mL 100 mg (07/30/18 1719)   PRN Meds:.acetaminophen, bisacodyl, ondansetron **OR** ondansetron (ZOFRAN) IV, senna-docusate  Antibiotics  :    Anti-infectives (From admission, onward)   Start     Dose/Rate Route Frequency Ordered Stop   07/30/18 1630  remdesivir 100 mg in sodium chloride 0.9 % 250 mL IVPB     100 mg 500 mL/hr over 30 Minutes Intravenous Every 24 hours 07/29/18 1530 08/03/18 1629   07/29/18 1630  remdesivir 200 mg in sodium chloride 0.9 % 250 mL IVPB     200 mg 500 mL/hr over 30 Minutes Intravenous Once 07/29/18 1530 07/29/18 1726       Time Spent in minutes  30   Lala Lund M.D on 07/31/2018 at 9:13 AM  To page go to [email or web address] - password [facility]  [facility] -  Office  [phone]  See all Orders from today for further details    Objective:   Vitals:   07/31/18 0400 07/31/18 0500 07/31/18 0600 07/31/18 0800  BP: (!) 111/57   111/63  Pulse: 73 74 77 85  Resp: _0 Temp: 98 F (36.7 C)   98.5 F (36.9 C)  TempSrc: Oral     SpO2: 94% 92%  93% 92%  Weight:  75.4 kg    Height:        Wt Readings from Last 3 Encounters:  07/31/18 75.4 kg     Intake/Output Summary (Last 24 hours) at 07/31/2018 0913 Last data filed at 07/30/2018 2200 Gross per 24 hour  Intake 1506.37 ml  Output --  Net 1506.37 ml     Physical Exam  Awake Alert, Oriented X 3, No new F.N deficits, Normal affect [place].AT,PERRAL Supple Neck,No JVD, No cervical lymphadenopathy appriciated.  Symmetrical Chest wall movement, Good air movement bilaterally, CTAB RRR,No Gallops, Rubs or new Murmurs, No Parasternal Heave +ve B.Sounds, Abd Soft, No tenderness, No organomegaly appriciated, No rebound - guarding or rigidity. No Cyanosis, Clubbing or edema, No new Rash or bruise    Data Review:    CBC Recent Labs  Lab 07/28/18 2210 07/29/18 1008 07/30/18 0445 07/31/18 0230  WBC 8.4 10.5 7.2  14.9*  HGB 12.9 12.7 12.3 11.4*  HCT 38.7 40.2 38.5 36.1  PLT 280 322 329 422*  MCV 86.4 88.0 88.1 89.6  MCH 28.8 27.8 28.1 28.3  MCHC 33.3 31.6 31.9 31.6  RDW 13.2 13.5 13.5 13.9  LYMPHSABS 0.7  --  0.5* 0.7  MONOABS 0.3  --  0.1 0.4  EOSABS 0.0  --  0.0 0.0  BASOSABS 0.0  --  0.0 0.0    Chemistries  Recent Labs  Lab 07/28/18 2210 07/29/18 1008 07/30/18 0445 07/31/18 0230  NA 130*  --  135 139  K 3.5  --  3.7 4.1  CL 99  --  104 106  CO2 20*  --  21* 22  GLUCOSE 129*  --  159* 169*  BUN 8  --  15 22*  CREATININE 0.63 0.79 0.62 0.67  CALCIUM 8.1*  --  8.5* 8.5*  MG  --   --  2.5* 2.3  AST 55*  --  53* 61*  ALT 65*  --  68* 78*  ALKPHOS 95  --  89 90  BILITOT 0.7  --  0.6 0.1*   ------------------------------------------------------------------------------------------------------------------ Recent Labs    07/28/18 2210  TRIG 107    No results found for: HGBA1C ------------------------------------------------------------------------------------------------------------------ No results for input(s): TSH, T4TOTAL, T3FREE, THYROIDAB in the last 72 hours.  Invalid input(s): FREET3  Cardiac Enzymes No results for input(s): CKMB, TROPONINI, MYOGLOBIN in the last 168 hours.  Invalid input(s): CK ------------------------------------------------------------------------------------------------------------------    Component Value Date/Time   BNP 78.6 07/31/2018 0230    Micro Results Recent Results (from the past 240 hour(s))  Culture, blood (routine x 2)     Status: None (Preliminary result)   Collection Time: 07/28/18 10:00 PM   Specimen: BLOOD LEFT ARM  Result Value Ref Range Status   Specimen Description   Final    BLOOD LEFT ARM Performed at [facility], [address] [place], [place] [address]    Special Requests   Final    BOTTLES DRAWN AEROBIC AND ANAEROBIC Blood Culture adequate volume Performed at [facility], [address] [place], [place] [address]    Culture   Final    NO GROWTH 2 DAYS Performed at [facility], [place] [address] [place], [place] [address]    Report Status PENDING  Incomplete  Culture, blood (routine x 2)     Status: None (Preliminary result)   Collection Time: 07/28/18 10:10 PM   Specimen: BLOOD RIGHT ARM  Result Value Ref Range Status  Specimen Description   Final    BLOOD RIGHT ARM Performed at [facility], [address] [place], [place] [address]    Special Requests   Final    BOTTLES DRAWN AEROBIC AND ANAEROBIC Blood Culture adequate volume   Culture   Final    NO GROWTH 2 DAYS Performed at [facility], [address] [place], [place] [address]    Report Status PENDING  Incomplete  SARS Coronavirus 2 (Hosp order,Performed in [facility] lab via Abbott ID)     Status: None   Collection Time: 07/28/18 11:00 PM   Specimen: Dry Nasal Swab (Abbott ID Now)  Result Value Ref Range Status   SARS Coronavirus 2 (Abbott ID Now) NEGATIVE NEGATIVE Final    Comment: (NOTE) Interpretive Result Comment(s): COVID 19 Positive SARS CoV 2 target nucleic acids are DETECTED. The SARS CoV 2 RNA is generally detectable in upper and lower respiratory specimens during the acute phase of infection.  Positive results are indicative of active infection with SARS CoV 2.  Clinical correlation with patient history and other diagnostic information is necessary to determine patient infection status.  Positive results do not rule out bacterial infection or coinfection with other viruses. The expected result is Negative. COVID 19 Negative SARS CoV 2 target nucleic acids are NOT DETECTED. The SARS CoV 2 RNA is generally detectable in upper and lower respiratory specimens during the acute phase of infection.  Negative results do not preclude SARS CoV 2 infection, do not rule out coinfections with other pathogens, and should not be used as the sole basis for treatment or  other patient management decisions.  Negative results must be combined with clinical  observations, patient history, and epidemiological information. The expected result is Negative. Invalid Presence or absence of SARS CoV 2 nucleic acids cannot be determined. Repeat testing was performed on the submitted specimen and repeated Invalid results were obtained.  If clinically indicated, additional testing on a new specimen with an alternate test methodology [phone]) is advised.  The SARS CoV 2 RNA is generally detectable in upper and lower respiratory specimens during the acute phase of infection. The expected result is Negative. Fact Sheet for Patients:  GolfingFamily.no Fact Sheet for Healthcare Providers: https://www.hernandez-brewer.com/ This test is not yet approved or cleared by the [place] FDA and has been authorized for detection and/or diagnosis of SARS CoV 2 by FDA under an Emergency Use Authorization (EUA).  This EUA will remain in effect (meaning this test can be used) for the duration of the COVID19 d eclaration under Section 564(b)(1) of the Act, 21 U.S.C. section 717 112 6746 3(b)(1), unless the authorization is terminated or revoked sooner. Performed at [facility], [address] [place], [place] [address]   SARS Coronavirus 2 (CEPHEID - Performed in [facility] hospital lab), Hosp Order     Status: Abnormal   Collection Time: 07/29/18  9:29 AM   Specimen: Nasal Mucosa; Nasopharyngeal  Result Value Ref Range Status   SARS Coronavirus 2 POSITIVE (A) NEGATIVE Final    Comment: RESULT CALLED TO, READ BACK BY AND VERIFIED WITH: S.Rosine Abe [id number] BY V.WILKINS (NOTE) If result is NEGATIVE SARS-CoV-2 target nucleic acids are NOT DETECTED. The SARS-CoV-2 RNA is generally detectable in upper and lower  respiratory specimens during the acute phase of infection. The lowest  concentration of SARS-CoV-2 viral copies this assay  can detect is 250  copies / mL. A negative result does not preclude SARS-CoV-2 infection  and should not be used as the sole basis for treatment or other  patient management decisions.  A negative result may occur with  improper specimen collection / handling, submission of specimen other  than nasopharyngeal swab, presence of viral mutation(s) within the  areas targeted by this assay, and inadequate number of viral copies  (<250 copies / mL). A negative result must be combined with clinical  observations, patient history, and epidemiological information. If result is POSITIVE SARS-CoV-2 target nucleic acids are DETECTED. The  SARS-CoV-2 RNA is generally detectable in upper and lower  respiratory specimens during the acute phase of infection.  Positive  results are indicative of active infection with SARS-CoV-2.  Clinical  correlation with patient history and other diagnostic information is  necessary to determine patient infection status.  Positive results do  not rule out bacterial infection or co-infection with other viruses. If result is PRESUMPTIVE POSTIVE SARS-CoV-2 nucleic acids MAY BE PRESENT.   A presumptive positive result was obtained on the submitted specimen  and confirmed on repeat testing.  While 2019 novel coronavirus  (SARS-CoV-2) nucleic acids may be present in the submitted sample  additional confirmatory testing may be necessary for epidemiological  and / or clinical management purposes  to differentiate between  SARS-CoV-2 and other Sarbecovirus currently known to infect humans.  If clinically indicated additional testing with an alternate test  methodology [phone]) is a dvised. The SARS-CoV-2 RNA is generally  detectable in upper and lower respiratory specimens during the acute  phase of infection. The expected result is Negative. Fact Sheet for Patients:  StrictlyIdeas.no Fact Sheet for Healthcare  Providers: BankingDealers.co.za This test is not yet approved or cleared by the [place] FDA and has been authorized for detection and/or diagnosis of SARS-CoV-2 by FDA under an Emergency Use Authorization (EUA).  This EUA will remain in effect (meaning this test can be used) for the duration of the COVID-19 declaration under Section 564(b)(1) of the Act, 21 U.S.C. section 360bbb-3(b)(1), unless the authorization is terminated or revoked sooner. Performed at [facility], [place] [address], [place], [place] [address]   Novel Coronavirus, NAA (hospital order; send-out to ref lab)     Status: Abnormal   Collection Time: 07/29/18  9:29 AM   Specimen: Nasopharyngeal Swab; Respiratory  Result Value Ref Range Status   SARS-CoV-2, NAA DETECTED (A) NOT DETECTED Final    Comment: (NOTE) This test was developed and its performance characteristics determined by [employer]. This test has not been FDA cleared or approved. This test has been authorized by FDA under an Emergency Use Authorization (EUA). This test is only authorized for the duration of time the declaration that circumstances exist justifying the authorization of the emergency use of in vitro diagnostic tests for detection of SARS-CoV-2 virus and/or diagnosis of COVID-19 infection under section 564(b)(1) of the Act, 21 U.S.C. 206ORV-6(F)(5), unless the authorization is terminated or revoked sooner. When diagnostic testing is negative, the possibility of a false negative result should be considered in the context of a patient's recent exposures and the presence of clinical signs and symptoms consistent with COVID-19. An individual without symptoms of COVID-19 and who is not shedding SARS-CoV-2 virus would expect to have a negative (not detected) result in this assay. Performed  At: [facility] [place], [place] [id number] Rush Farmer MD YJ:[id number]     [place]  Final    Comment: Performed at [place] [address] [place],  [place] [address]  MRSA PCR Screening     Status: None   Collection Time: 07/29/18  9:29 AM   Specimen: Nasal Mucosa; Nasopharyngeal  Result Value Ref Range Status   MRSA by PCR NEGATIVE NEGATIVE Final    Comment:        The GeneXpert MRSA Assay (FDA approved for NASAL specimens only), is one component of a comprehensive MRSA colonization surveillance program. It is not intended to diagnose MRSA infection nor to guide or monitor treatment for MRSA infections. Performed at [facility], [place] [address] [place], [place] [address]   Respiratory Panel by PCR     Status: None   Collection Time: 07/29/18 12:14 PM   Specimen: Nasopharyngeal Swab; Respiratory  Result Value Ref Range Status   Adenovirus NOT DETECTED NOT DETECTED Final   Coronavirus 229E NOT DETECTED NOT DETECTED Final    Comment: (NOTE) The Coronavirus on the Respiratory Panel, DOES NOT test for the novel  Coronavirus (2019 nCoV)    Coronavirus HKU1 NOT DETECTED NOT DETECTED Final   Coronavirus NL63 NOT DETECTED NOT DETECTED Final   Coronavirus OC43 NOT DETECTED NOT DETECTED Final   Metapneumovirus NOT DETECTED NOT DETECTED Final   Rhinovirus / Enterovirus NOT DETECTED NOT DETECTED Final   Influenza A NOT DETECTED NOT DETECTED Final   Influenza B NOT DETECTED NOT DETECTED Final   Parainfluenza Virus 1 NOT DETECTED NOT DETECTED Final   Parainfluenza Virus 2 NOT DETECTED NOT DETECTED Final   Parainfluenza Virus 3 NOT DETECTED NOT DETECTED Final   Parainfluenza Virus 4 NOT DETECTED NOT DETECTED Final   Respiratory Syncytial Virus NOT DETECTED NOT DETECTED Final   Bordetella pertussis NOT DETECTED NOT DETECTED Final   Chlamydophila pneumoniae NOT DETECTED NOT DETECTED Final   Mycoplasma pneumoniae NOT DETECTED NOT DETECTED Final    Comment: Performed at [facility],  [address] [place], [place] [address]    Radiology Reports Ct Abdomen Pelvis W Contrast  Result Date: 07/29/2018 CLINICAL DATA:  58 year old female with right upper quadrant abdominal pain. Strong clinical suspicion for COVID-19. EXAM: CT ABDOMEN AND PELVIS WITH CONTRAST TECHNIQUE: Multidetector CT imaging of the abdomen and pelvis was performed using the standard protocol following bolus administration of intravenous contrast. CONTRAST:  154m OMNIPAQUE IOHEXOL 300 MG/ML  SOLN COMPARISON:  None. FINDINGS: Lower chest: Bilateral patchy lower lobe consolidative changes as well as scattered bilateral confluent hazy densities noted. There are a spectrum of findings in the lungs which can be seen with acute atypical infection (as well as other non-infectious etiologies). In particular, viral pneumonia (including COVID-19) should be considered in the appropriate clinical setting. No intra-abdominal free air or free fluid. Hepatobiliary: Probable mild fatty liver. No intrahepatic biliary ductal dilatation. No calcified gallstone. Possible mild adenomyomatosis of the gallbladder fundus. Pancreas: Unremarkable. No pancreatic ductal dilatation or surrounding inflammatory changes. Spleen: Normal in size without focal abnormality. Adrenals/Urinary Tract: The adrenal glands, kidneys, and visualized ureters and urinary bladder appear unremarkable. Stomach/Bowel: There is no bowel obstruction or active inflammation. Normal appendix. Vascular/Lymphatic: No significant vascular findings are present. No enlarged abdominal or pelvic lymph nodes. Reproductive: The uterus is anteverted and grossly unremarkable. There is a 4 cm left ovarian/paraovarian cyst. Ultrasound is recommended for better evaluation on a nonemergent basis. The right ovary is unremarkable. Other: Small fat containing umbilical hernia. Musculoskeletal: Degenerative changes at L5-S1 disc. No acute osseous pathology. IMPRESSION: 1. No acute intra-abdominal or  pelvic pathology. 2. Bilateral pulmonary densities likely combination of  atelectasis and atypical infection. Please see discussion above. 3. A 4 cm left ovarian/paraovarian cyst. Electronically Signed   By: Anner Crete M.D.   On: 07/29/2018 02:00   Dg Chest Port 1 View  Result Date: 07/30/2018 CLINICAL DATA:  Shortness of breath.  COVID-19. EXAM: PORTABLE CHEST 1 VIEW COMPARISON:  CT 07/29/2018.  Chest x-ray 07/28/2018. FINDINGS: Mediastinum hilar structures normal. Heart size stable. Persistent dense bibasilar atelectasis/infiltrates. Tiny bilateral pleural effusions cannot be excluded. No pneumothorax. IMPRESSION: Persistent dense bibasilar atelectasis/infiltrates. Tiny bilateral pleural effusions cannot be excluded. Similar findings noted on prior CT. Electronically Signed   By: Marcello Moores  Register   On: 07/30/2018 06:54   Dg Chest Port 1 View  Result Date: 07/28/2018 CLINICAL DATA:  Cough, fever, nausea, vomiting, and worsening chest pain for 8 days, has been in close contact with a COVID-19 positive individual EXAM: PORTABLE CHEST 1 VIEW COMPARISON:  Portable exam 2218 hours without priors for comparison FINDINGS: Normal heart size, mediastinal contours and pulmonary vascularity. Hazy infiltrate RIGHT mid lung. Additional infiltrate versus atelectasis at LEFT base. Upper lungs clear. No pleural effusion or pneumothorax. Bones demineralized. IMPRESSION: Hazy infiltrate RIGHT mid lung with more prominent opacity at LEFT base which could represent infiltrate or atelectasis. Electronically Signed   By: Lavonia Dana M.D.   On: 07/28/2018 23:07

## 2018-08-01 LAB — CBC WITH DIFFERENTIAL/PLATELET
Abs Immature Granulocytes: 0.64 10*3/uL — ABNORMAL HIGH (ref 0.00–0.07)
Basophils Absolute: 0 10*3/uL (ref 0.0–0.1)
Basophils Relative: 0 %
Eosinophils Absolute: 0 10*3/uL (ref 0.0–0.5)
Eosinophils Relative: 0 %
HCT: 37.5 % (ref 36.0–46.0)
Hemoglobin: 11.8 g/dL — ABNORMAL LOW (ref 12.0–15.0)
Immature Granulocytes: 4 %
Lymphocytes Relative: 5 %
Lymphs Abs: 0.9 10*3/uL (ref 0.7–4.0)
MCH: 28.1 pg (ref 26.0–34.0)
MCHC: 31.5 g/dL (ref 30.0–36.0)
MCV: 89.3 fL (ref 80.0–100.0)
Monocytes Absolute: 0.4 10*3/uL (ref 0.1–1.0)
Monocytes Relative: 3 %
Neutro Abs: 14.1 10*3/uL — ABNORMAL HIGH (ref 1.7–7.7)
Neutrophils Relative %: 88 %
Platelets: 489 10*3/uL — ABNORMAL HIGH (ref 150–400)
RBC: 4.2 MIL/uL (ref 3.87–5.11)
RDW: 14 % (ref 11.5–15.5)
WBC: 16.1 10*3/uL — ABNORMAL HIGH (ref 4.0–10.5)
nRBC: 0.3 % — ABNORMAL HIGH (ref 0.0–0.2)

## 2018-08-01 LAB — COMPREHENSIVE METABOLIC PANEL
ALT: 247 U/L — ABNORMAL HIGH (ref 0–44)
AST: 172 U/L — ABNORMAL HIGH (ref 15–41)
Albumin: 3 g/dL — ABNORMAL LOW (ref 3.5–5.0)
Alkaline Phosphatase: 100 U/L (ref 38–126)
Anion gap: 8 (ref 5–15)
BUN: 21 mg/dL — ABNORMAL HIGH (ref 6–20)
CO2: 24 mmol/L (ref 22–32)
Calcium: 8.5 mg/dL — ABNORMAL LOW (ref 8.9–10.3)
Chloride: 106 mmol/L (ref 98–111)
Creatinine, Ser: 0.64 mg/dL (ref 0.44–1.00)
GFR calc Af Amer: >60 mL/min (ref >60)
GFR calc non Af Amer: >60 mL/min (ref >60)
Glucose, Bld: 185 mg/dL — ABNORMAL HIGH (ref 70–99)
Potassium: 4.3 mmol/L (ref 3.5–5.1)
Sodium: 138 mmol/L (ref 135–145)
Total Bilirubin: 0.2 mg/dL — ABNORMAL LOW (ref 0.3–1.2)
Total Protein: 6.4 g/dL — ABNORMAL LOW (ref 6.5–8.1)

## 2018-08-01 LAB — BRAIN NATRIURETIC PEPTIDE: B Natriuretic Peptide: 155.1 pg/mL — ABNORMAL HIGH (ref 0.0–100.0)

## 2018-08-01 LAB — FERRITIN: Ferritin: 1294 ng/mL — ABNORMAL HIGH (ref 11–307)

## 2018-08-01 LAB — D-DIMER, QUANTITATIVE: D-Dimer, Quant: 0.69 ug/mL-FEU — ABNORMAL HIGH (ref 0.00–0.50)

## 2018-08-01 LAB — C-REACTIVE PROTEIN: CRP: 2.3 mg/dL — ABNORMAL HIGH (ref <1.0)

## 2018-08-01 LAB — MAGNESIUM: Magnesium: 2.3 mg/dL (ref 1.7–2.4)

## 2018-08-01 LAB — PROTIME-INR
INR: 1 (ref 0.8–1.2)
Prothrombin Time: 13.4 seconds (ref 11.4–15.2)

## 2018-08-01 LAB — LACTATE DEHYDROGENASE: LDH: 357 U/L — ABNORMAL HIGH (ref 98–192)

## 2018-08-01 MED ORDER — FUROSEMIDE 10 MG/ML IJ SOLN
40.0000 mg | Freq: Once | INTRAMUSCULAR | Status: AC
  Administered 2018-08-01: 40 mg via INTRAVENOUS
  Filled 2018-08-01: qty 4

## 2018-08-01 NOTE — Progress Notes (Signed)
The pt's Daughter, Kathlee Nations, was called and provided with a daily update regarding the pt's current status.

## 2018-08-01 NOTE — Care Management (Signed)
Hospital follow-up televist appointment arranged with: [name] on August 14, 2018 @ 0930; AVS updated.  Midge Minium RN, BSN, NCM-BC, ACM-RN [phone]

## 2018-08-01 NOTE — Progress Notes (Signed)
PROGRESS NOTE                                                                                                                                                                                                             Patient Demographics:    Tanya Sims, is a 58 y.o. female, DOB - May 26, 1960, ENM:[id number]  Outpatient Primary MD for the patient is Patient, No Pcp Per    LOS - 3  Admit date - 07/28/2018    Chief Complaint  Patient presents with   Fever       Brief Narrative  Tanya Sims is a 58 y.o. female with medical history significant of GERD presented to [facility] with a complaint of cough, fever and shortness of breath that started about 8 days ago and has been getting worse, she was diagnosed with acute hypoxic respiratory failure due to COVID-19 pneumonitis and admitted.   Subjective:   Patient in bed, appears comfortable, denies any headache, no fever, no chest pain or pressure, much improved shortness of breath , no abdominal pain. No focal weakness.   Assessment  & Plan :     1. Acute Hypoxic Resp. Failure due to Acute Covid 19 Viral Pneumonitis during the ongoing 2020 Covid 19 Pandemic - she had severe hypoxia requiring 8 L high flow nasal cannula oxygen, she was started on IV steroids, REMDESIVIR and Actemra, inflammatory markers are being monitored closely as they are still elevated, clinically she is improving, hypoxia is much better, continue to titrate oxygen down as tolerated, encourage activity with sitting up in chair and using flutter valve for pulmonary toiletry.  Daughter was updated twice last on 07/31/2018.   COVID-19 Labs  Recent Labs    07/30/18 0445 07/31/18 0230 08/01/18 0433  DDIMER 0.66* 0.50 0.69*  FERRITIN 853* 905* 1,294*  LDH 239* 261* 357*  CRP 13.3* 5.3* 2.3*    Lab Results  Component Value Date   SARSCOV2NAA POSITIVE (A) 07/29/2018   SARSCOV2NAA  DETECTED (A) 07/29/2018   SARSCOV2NAA NEGATIVE 07/28/2018     Hepatic Function Latest Ref Rng & Units 08/01/2018 07/31/2018 07/30/2018  Total Protein 6.5 - 8.1 g/dL 6.4(L) 6.4(L) 7.4  Albumin 3.5 - 5.0 g/dL 3.0(L) 2.8(L) 3.2(L)  AST 15 - 41 U/L 172(H) 61(H) 53(H)  ALT 0 - 44 U/L 247(H) 78(H) 68(H)  Alk Phosphatase 38 - 126 U/L 100 90 89  Total Bilirubin 0.3 - 1.2 mg/dL 0.2(L) 0.1(L) 0.6        Component Value Date/Time   BNP 155.1 (H) 08/01/2018 0433      2.  GERD.  On PPI.  3.  Musculoskeletal epigastric abdominal pain due to persistent cough.  Completely resolved.    4.  Mildly elevated LFTs relatively stable when accounted for COVID-19 infection and Actemra use.  CT scan nonacute.  Managed with supportive care for cough and pain.     Condition - Extremely Guarded  Family Communication  : Daughter updated 07/31/2018.  Code Status :  Full  Diet :   Diet Order            Diet regular Room service appropriate? Yes; Fluid consistency: Thin  Diet effective now               Disposition Plan  : Medical bed DC telemetry.  Consults  :  None  Procedures  :    CT Abd - non acute  PUD Prophylaxis : PPI  DVT Prophylaxis  :  Lovenox ordered  Lab Results  Component Value Date   PLT 489 (H) 08/01/2018    Inpatient Medications  Scheduled Meds:  albuterol  2 puff Inhalation Q6H   Chlorhexidine Gluconate Cloth  6 each Topical Daily   chlorpheniramine-HYDROcodone  5 mL Oral Q12H   enoxaparin (LOVENOX) injection  40 mg Subcutaneous Q24H   feeding supplement (ENSURE ENLIVE)  237 mL Oral BID BM   mouth rinse  15 mL Mouth Rinse BID   methylPREDNISolone (SOLU-MEDROL) injection  60 mg Intravenous Q12H   pantoprazole  40 mg Oral Daily   sodium chloride flush  3 mL Intravenous Q12H   Continuous Infusions:  remdesivir 100 mg in NS 250 mL 100 mg (07/31/18 1620)   PRN Meds:.acetaminophen, bisacodyl, ondansetron **OR** ondansetron (ZOFRAN) IV,  senna-docusate  Antibiotics  :    Anti-infectives (From admission, onward)   Start     Dose/Rate Route Frequency Ordered Stop   07/30/18 1630  remdesivir 100 mg in sodium chloride 0.9 % 250 mL IVPB     100 mg 500 mL/hr over 30 Minutes Intravenous Every 24 hours 07/29/18 1530 08/03/18 1629   07/29/18 1630  remdesivir 200 mg in sodium chloride 0.9 % 250 mL IVPB     200 mg 500 mL/hr over 30 Minutes Intravenous Once 07/29/18 1530 07/29/18 1726       Time Spent in minutes  30   Lala Lund M.D on 08/01/2018 at 9:54 AM  To page go to [email or web address] - password [facility]  [facility] -  Office  [phone]  See all Orders from today for further details    Objective:   Vitals:   08/01/18 0600 08/01/18 0803 08/01/18 0812 08/01/18 0816  BP:  132/76    Pulse: 90 (!) 115 (!) 131   Resp: 19     Temp:  98.5 F (36.9 C)    TempSrc:      SpO2: (!) 88% (!) 88% (!) 80% 90%  Weight:      Height:        Wt Readings from Last 3 Encounters:  08/01/18 75 kg     Intake/Output Summary (Last 24 hours) at 08/01/2018 0954 Last data filed at 08/01/2018 0800 Gross per 24 hour  Intake 240 ml  Output 500 ml  Net -260 ml     Physical Exam  Awake Alert,  No new F.N deficits, Normal affect Fabens.AT,PERRAL Supple Neck,No JVD, No cervical lymphadenopathy appriciated.  Symmetrical Chest wall movement, Good air movement bilaterally, CTAB RRR,No Gallops, Rubs or new Murmurs, No Parasternal Heave +ve B.Sounds, Abd Soft, No tenderness, No organomegaly appriciated, No rebound - guarding or rigidity. No Cyanosis, Clubbing or edema, No new Rash or bruise    Data Review:    CBC Recent Labs  Lab 07/28/18 2210 07/29/18 1008 07/30/18 0445 07/31/18 0230 08/01/18 0433  WBC 8.4 10.5 7.2 14.9* 16.1*  HGB 12.9 12.7 12.3 11.4* 11.8*  HCT 38.7 40.2 38.5 36.1 37.5  PLT 280 322 329 422* 489*  MCV 86.4 88.0 88.1 89.6 89.3  MCH 28.8 27.8 28.1 28.3 28.1  MCHC 33.3 31.6 31.9 31.6 31.5  RDW 13.2  13.5 13.5 13.9 14.0  LYMPHSABS 0.7  --  0.5* 0.7 0.9  MONOABS 0.3  --  0.1 0.4 0.4  EOSABS 0.0  --  0.0 0.0 0.0  BASOSABS 0.0  --  0.0 0.0 0.0    Chemistries  Recent Labs  Lab 07/28/18 2210 07/29/18 1008 07/30/18 0445 07/31/18 0230 08/01/18 0433  NA 130*  --  135 139 138  K 3.5  --  3.7 4.1 4.3  CL 99  --  104 106 106  CO2 20*  --  21* 22 24  GLUCOSE 129*  --  159* 169* 185*  BUN 8  --  15 22* 21*  CREATININE 0.63 0.79 0.62 0.67 0.64  CALCIUM 8.1*  --  8.5* 8.5* 8.5*  MG  --   --  2.5* 2.3 2.3  AST 55*  --  53* 61* 172*  ALT 65*  --  68* 78* 247*  ALKPHOS 95  --  89 90 100  BILITOT 0.7  --  0.6 0.1* 0.2*   ------------------------------------------------------------------------------------------------------------------ No results for input(s): CHOL, HDL, LDLCALC, TRIG, CHOLHDL, LDLDIRECT in the last 72 hours.  No results found for: HGBA1C ------------------------------------------------------------------------------------------------------------------ No results for input(s): TSH, T4TOTAL, T3FREE, THYROIDAB in the last 72 hours.  Invalid input(s): FREET3  Cardiac Enzymes No results for input(s): CKMB, TROPONINI, MYOGLOBIN in the last 168 hours.  Invalid input(s): CK ------------------------------------------------------------------------------------------------------------------    Component Value Date/Time   BNP 155.1 (H) 08/01/2018 0433    Micro Results Recent Results (from the past 240 hour(s))  Culture, blood (routine x 2)     Status: None (Preliminary result)   Collection Time: 07/28/18 10:00 PM   Specimen: BLOOD LEFT ARM  Result Value Ref Range Status   Specimen Description   Final    BLOOD LEFT ARM Performed at [facility], [address] [place], [place] [address]    Special Requests   Final    BOTTLES DRAWN AEROBIC AND ANAEROBIC Blood Culture adequate volume Performed at [facility], [address] [place], [place]  [address]    Culture   Final    NO GROWTH 3 DAYS Performed at [facility], [place] [address] [place], [place] [address]    Report Status PENDING  Incomplete  Culture, blood (routine x 2)     Status: None (Preliminary result)   Collection Time: 07/28/18 10:10 PM   Specimen: BLOOD RIGHT ARM  Result Value Ref Range Status   Specimen Description   Final    BLOOD RIGHT ARM Performed at [facility], [address] [place], [place] [address]    Special Requests   Final    BOTTLES DRAWN  AEROBIC AND ANAEROBIC Blood Culture adequate volume   Culture   Final    NO GROWTH 3 DAYS Performed at [facility], [place] [address] [place], [place] [address]    Report Status PENDING  Incomplete  SARS Coronavirus 2 (Hosp order,Performed in [facility] lab via Abbott ID)     Status: None   Collection Time: 07/28/18 11:00 PM   Specimen: Dry Nasal Swab (Abbott ID Now)  Result Value Ref Range Status   SARS Coronavirus 2 (Abbott ID Now) NEGATIVE NEGATIVE Final    Comment: (NOTE) Interpretive Result Comment(s): COVID 19 Positive SARS CoV 2 target nucleic acids are DETECTED. The SARS CoV 2 RNA is generally detectable in upper and lower respiratory specimens during the acute phase of infection.  Positive results are indicative of active infection with SARS CoV 2.  Clinical correlation with patient history and other diagnostic information is necessary to determine patient infection status.  Positive results do not rule out bacterial infection or coinfection with other viruses. The expected result is Negative. COVID 19 Negative SARS CoV 2 target nucleic acids are NOT DETECTED. The SARS CoV 2 RNA is generally detectable in upper and lower respiratory specimens during the acute phase of infection.  Negative results do not preclude SARS CoV 2 infection, do not rule out coinfections with other pathogens, and should not be used as the sole basis for treatment or other patient management  decisions.  Negative results must be combined with clinical  observations, patient history, and epidemiological information. The expected result is Negative. Invalid Presence or absence of SARS CoV 2 nucleic acids cannot be determined. Repeat testing was performed on the submitted specimen and repeated Invalid results were obtained.  If clinically indicated, additional testing on a new specimen with an alternate test methodology [phone]) is advised.  The SARS CoV 2 RNA is generally detectable in upper and lower respiratory specimens during the acute phase of infection. The expected result is Negative. Fact Sheet for Patients:  GolfingFamily.no Fact Sheet for Healthcare Providers: https://www.hernandez-brewer.com/ This test is not yet approved or cleared by the [place] FDA and has been authorized for detection and/or diagnosis of SARS CoV 2 by FDA under an Emergency Use Authorization (EUA).  This EUA will remain in effect (meaning this test can be used) for the duration of the COVID19 d eclaration under Section 564(b)(1) of the Act, 21 U.S.C. section [phone] 3(b)(1), unless the authorization is terminated or revoked sooner. Performed at [facility], [address] [place], [place] [address]   SARS Coronavirus 2 (CEPHEID - Performed in [facility] hospital lab), Hosp Order     Status: Abnormal   Collection Time: 07/29/18  9:29 AM   Specimen: Nasal Mucosa; Nasopharyngeal  Result Value Ref Range Status   SARS Coronavirus 2 POSITIVE (A) NEGATIVE Final    Comment: RESULT CALLED TO, READ BACK BY AND VERIFIED WITH: S.Rosine Abe [id number] BY V.WILKINS (NOTE) If result is NEGATIVE SARS-CoV-2 target nucleic acids are NOT DETECTED. The SARS-CoV-2 RNA is generally detectable in upper and lower  respiratory specimens during the acute phase of infection. The lowest  concentration of SARS-CoV-2 viral copies this assay can detect is 250   copies / mL. A negative result does not preclude SARS-CoV-2 infection  and should not be used as the sole basis for treatment or other  patient management decisions.  A negative result may occur with  improper specimen collection / handling, submission of specimen other  than nasopharyngeal  swab, presence of viral mutation(s) within the  areas targeted by this assay, and inadequate number of viral copies  (<250 copies / mL). A negative result must be combined with clinical  observations, patient history, and epidemiological information. If result is POSITIVE SARS-CoV-2 target nucleic acids are DETECTED. The  SARS-CoV-2 RNA is generally detectable in upper and lower  respiratory specimens during the acute phase of infection.  Positive  results are indicative of active infection with SARS-CoV-2.  Clinical  correlation with patient history and other diagnostic information is  necessary to determine patient infection status.  Positive results do  not rule out bacterial infection or co-infection with other viruses. If result is PRESUMPTIVE POSTIVE SARS-CoV-2 nucleic acids MAY BE PRESENT.   A presumptive positive result was obtained on the submitted specimen  and confirmed on repeat testing.  While 2019 novel coronavirus  (SARS-CoV-2) nucleic acids may be present in the submitted sample  additional confirmatory testing may be necessary for epidemiological  and / or clinical management purposes  to differentiate between  SARS-CoV-2 and other Sarbecovirus currently known to infect humans.  If clinically indicated additional testing with an alternate test  methodology [phone]) is a dvised. The SARS-CoV-2 RNA is generally  detectable in upper and lower respiratory specimens during the acute  phase of infection. The expected result is Negative. Fact Sheet for Patients:  StrictlyIdeas.no Fact Sheet for Healthcare  Providers: BankingDealers.co.za This test is not yet approved or cleared by the [place] FDA and has been authorized for detection and/or diagnosis of SARS-CoV-2 by FDA under an Emergency Use Authorization (EUA).  This EUA will remain in effect (meaning this test can be used) for the duration of the COVID-19 declaration under Section 564(b)(1) of the Act, 21 U.S.C. section 360bbb-3(b)(1), unless the authorization is terminated or revoked sooner. Performed at [facility], [place] [address], [place], [place] [address]   Novel Coronavirus, NAA (hospital order; send-out to ref lab)     Status: Abnormal   Collection Time: 07/29/18  9:29 AM   Specimen: Nasopharyngeal Swab; Respiratory  Result Value Ref Range Status   SARS-CoV-2, NAA DETECTED (A) NOT DETECTED Final    Comment: (NOTE) This test was developed and its performance characteristics determined by [employer] and [employer]. This test has not been FDA cleared or approved. This test has been authorized by FDA under an Emergency Use Authorization (EUA). This test is only authorized for the duration of time the declaration that circumstances exist justifying the authorization of the emergency use of in vitro diagnostic tests for detection of SARS-CoV-2 virus and/or diagnosis of COVID-19 infection under section 564(b)(1) of the Act, 21 U.S.C. 412INO-6(V)(6), unless the authorization is terminated or revoked sooner. When diagnostic testing is negative, the possibility of a false negative result should be considered in the context of a patient's recent exposures and the presence of clinical signs and symptoms consistent with COVID-19. An individual without symptoms of COVID-19 and who is not shedding SARS-CoV-2 virus would expect to have a negative (not detected) result in this assay. Performed  At: [facility] [place], [place] [id number] Rush Farmer MD GE:[id number]     Ali Chuk  Final    Comment: Performed at [place] [address], [place], [place] [address]  MRSA PCR Screening     Status: None   Collection Time: 07/29/18  9:29 AM   Specimen: Nasal Mucosa; Nasopharyngeal  Result Value Ref Range Status   MRSA by PCR  NEGATIVE NEGATIVE Final    Comment:        The GeneXpert MRSA Assay (FDA approved for NASAL specimens only), is one component of a comprehensive MRSA colonization surveillance program. It is not intended to diagnose MRSA infection nor to guide or monitor treatment for MRSA infections. Performed at [facility], [place] [address], [place], [place] [address]   Respiratory Panel by PCR     Status: None   Collection Time: 07/29/18 12:14 PM   Specimen: Nasopharyngeal Swab; Respiratory  Result Value Ref Range Status   Adenovirus NOT DETECTED NOT DETECTED Final   Coronavirus 229E NOT DETECTED NOT DETECTED Final    Comment: (NOTE) The Coronavirus on the Respiratory Panel, DOES NOT test for the novel  Coronavirus (2019 nCoV)    Coronavirus HKU1 NOT DETECTED NOT DETECTED Final   Coronavirus NL63 NOT DETECTED NOT DETECTED Final   Coronavirus OC43 NOT DETECTED NOT DETECTED Final   Metapneumovirus NOT DETECTED NOT DETECTED Final   Rhinovirus / Enterovirus NOT DETECTED NOT DETECTED Final   Influenza A NOT DETECTED NOT DETECTED Final   Influenza B NOT DETECTED NOT DETECTED Final   Parainfluenza Virus 1 NOT DETECTED NOT DETECTED Final   Parainfluenza Virus 2 NOT DETECTED NOT DETECTED Final   Parainfluenza Virus 3 NOT DETECTED NOT DETECTED Final   Parainfluenza Virus 4 NOT DETECTED NOT DETECTED Final   Respiratory Syncytial Virus NOT DETECTED NOT DETECTED Final   Bordetella pertussis NOT DETECTED NOT DETECTED Final   Chlamydophila pneumoniae NOT DETECTED NOT DETECTED Final   Mycoplasma pneumoniae NOT DETECTED NOT DETECTED Final    Comment: Performed at [facility],  [address], [place], [place] [address]    Radiology Reports Ct Abdomen Pelvis W Contrast  Result Date: 07/29/2018 CLINICAL DATA:  58 year old female with right upper quadrant abdominal pain. Strong clinical suspicion for COVID-19. EXAM: CT ABDOMEN AND PELVIS WITH CONTRAST TECHNIQUE: Multidetector CT imaging of the abdomen and pelvis was performed using the standard protocol following bolus administration of intravenous contrast. CONTRAST:  136m OMNIPAQUE IOHEXOL 300 MG/ML  SOLN COMPARISON:  None. FINDINGS: Lower chest: Bilateral patchy lower lobe consolidative changes as well as scattered bilateral confluent hazy densities noted. There are a spectrum of findings in the lungs which can be seen with acute atypical infection (as well as other non-infectious etiologies). In particular, viral pneumonia (including COVID-19) should be considered in the appropriate clinical setting. No intra-abdominal free air or free fluid. Hepatobiliary: Probable mild fatty liver. No intrahepatic biliary ductal dilatation. No calcified gallstone. Possible mild adenomyomatosis of the gallbladder fundus. Pancreas: Unremarkable. No pancreatic ductal dilatation or surrounding inflammatory changes. Spleen: Normal in size without focal abnormality. Adrenals/Urinary Tract: The adrenal glands, kidneys, and visualized ureters and urinary bladder appear unremarkable. Stomach/Bowel: There is no bowel obstruction or active inflammation. Normal appendix. Vascular/Lymphatic: No significant vascular findings are present. No enlarged abdominal or pelvic lymph nodes. Reproductive: The uterus is anteverted and grossly unremarkable. There is a 4 cm left ovarian/paraovarian cyst. Ultrasound is recommended for better evaluation on a nonemergent basis. The right ovary is unremarkable. Other: Small fat containing umbilical hernia. Musculoskeletal: Degenerative changes at L5-S1 disc. No acute osseous pathology. IMPRESSION: 1. No acute intra-abdominal or  pelvic pathology. 2. Bilateral pulmonary densities likely combination of atelectasis and atypical infection. Please see discussion above. 3. A 4 cm left ovarian/paraovarian cyst. Electronically Signed   By: AAnner CreteM.D.   On: 07/29/2018 02:00   [facility]  Result Date: 07/30/2018 CLINICAL DATA:  Shortness of breath.  COVID-19. EXAM: PORTABLE CHEST 1 VIEW COMPARISON:  CT 07/29/2018.  Chest x-ray 07/28/2018. FINDINGS: Mediastinum hilar structures normal. Heart size stable. Persistent dense bibasilar atelectasis/infiltrates. Tiny bilateral pleural effusions cannot be excluded. No pneumothorax. IMPRESSION: Persistent dense bibasilar atelectasis/infiltrates. Tiny bilateral pleural effusions cannot be excluded. Similar findings noted on prior CT. Electronically Signed   By: Marcello Moores  Register   On: 07/30/2018 06:54   Dg Chest Port 1 View  Result Date: 07/28/2018 CLINICAL DATA:  Cough, fever, nausea, vomiting, and worsening chest pain for 8 days, has been in close contact with a COVID-19 positive individual EXAM: PORTABLE CHEST 1 VIEW COMPARISON:  Portable exam 2218 hours without priors for comparison FINDINGS: Normal heart size, mediastinal contours and pulmonary vascularity. Hazy infiltrate RIGHT mid lung. Additional infiltrate versus atelectasis at LEFT base. Upper lungs clear. No pleural effusion or pneumothorax. Bones demineralized. IMPRESSION: Hazy infiltrate RIGHT mid lung with more prominent opacity at LEFT base which could represent infiltrate or atelectasis. Electronically Signed   By: Lavonia Dana M.D.   On: 07/28/2018 23:07

## 2018-08-01 NOTE — Progress Notes (Signed)
Physical Therapy Treatment Patient Details Name: Tanya Sims MRN: [id number] DOB: 11/27/1960 Today's Date: 08/01/2018    History of Present Illness 58 yo female admitted 6/28/with worsening SOB, fever, cough, positive for covid, hypoxic respiratory distress.    PT Comments    Patient  Is independent with mobility. Patient ambulated in room on RA with  SaO2 dropping to 81%. Placed on 2 liters  At rest with sats at 89%, Ambulated x 60' on 2 L with SaO2 drop to 84%, increased to 4 L  For remainder with SaO2 no higher than 88%. Replaced on 6 L in room , back to 95%. RN notified of desaturation. Continue PT and walking saturation testing for DC.     aware.   Follow Up Recommendations  No PT follow up     Equipment Recommendations  None recommended by PT    Recommendations for Other Services       Precautions / Restrictions Precautions Precaution Comments: monitorVS, Restrictions Weight Bearing Restrictions: No    Mobility  Bed Mobility               General bed mobility comments: OOB in recliner  Transfers Overall transfer level: Independent               [facility]  Ambulation/Gait Ambulation/Gait assistance: Supervision Gait Distance (Feet): 220 Feet Assistive device: None(pt pushed O2 tank) Gait Pattern/deviations: WFL(Within Functional Limits)   Gait velocity interpretation: >2.62 ft/sec, indicative of community ambulatory     Stairs             Wheelchair Mobility    Modified Rankin (Stroke Patients Only)       Balance                                            Cognition Arousal/Alertness: Awake/alert                                     General Comments: [id number]: [id number] stratus      Exercises      General Comments        Pertinent Vitals/Pain Faces Pain Scale: Hurts little more Pain Location: abdomen/head Pain Descriptors / Indicators:  Aching;Discomfort Pain Intervention(s): Monitored during session    Home Living                      Prior Function            PT Goals (current goals can now be found in the care plan section) Progress towards PT goals: Progressing toward goals    Frequency    Min 3X/week      PT Plan Current plan remains appropriate    Co-evaluation              AM-PAC PT "6 Clicks" Mobility   Outcome Measure  Help needed turning from your back to your side while in a flat bed without using bedrails?: None Help needed moving from lying on your back to sitting on the side of a flat bed without using bedrails?: None Help needed moving to and from a bed to a chair (including a wheelchair)?: None Help needed standing up from a chair using your arms (e.g., wheelchair or bedside chair)?: None Help needed to walk in  hospital room?: None Help needed climbing 3-5 steps with a railing? : A Little 6 Click Score: 23    End of Session Equipment Utilized During Treatment: Oxygen Activity Tolerance: Patient tolerated treatment well Patient left: in chair Nurse Communication: Mobility status(drop in sats) PT Visit Diagnosis: Difficulty in walking, not elsewhere classified (R26.2)     Time: 1010-1045 PT Time Calculation (min) (ACUTE ONLY): 35 min  Charges:  $Gait Training: 23-37 mins                     [place] [phone] Office [phone]    Claretha Cooper 08/01/2018, 10:54 AM

## 2018-08-01 NOTE — Progress Notes (Signed)
ALT = 247 which is >5xULN. D/w Dr Candiss Norse and we will hold it for now and f/u with level in AM.  Onnie Boer, PharmD, BCIDP, AAHIVP, CPP Infectious Disease Pharmacist 08/01/2018 2:30 PM

## 2018-08-01 NOTE — TOC Initial Note (Addendum)
Transition of Care [facility](TOC) - Initial/Assessment Note    Patient Details  Name: Tanya LeydenMatilde Flippen MRN: [id number] Date of Birth: 05/17/1960  Transition of Care [facility](TOC) CM/SW Contact:    Colleen CanNatalie Abisai Coble RN, BSN, NCM-BC, ACM-RN [phone] (working remotely) Phone Number: 08/01/2018, 4:40 PM  Clinical Narrative:                 CM following patient for transitional needs. CM contacted the patient via phone utilizing [employer] (ID# [phone]) to discuss the POC. Patient states living at home with her spouse/daughter and being independent with her ADLs with no AD in use PTA. Patient reports not having any active insurance, nor a PCP, but agreeable to CM arranging a f/u appointment; [facility] @ 0930; AVS updated. Patient will need home O2 at discharge, with orders entered by Dr. Thedore MinsSingh. Charity home oxygen/BSC will be arranged with [employer], with a portable tank to be provided prior to DC. Patient states having a working thermometer; will be provided a pulse ox prior to DC.Patient indicated her spouse will be available to provide transportation home and for delivery of the oxygen concentrator/BSC. CM team will continue to follow.  Expected Discharge Plan: Home/Self Care Barriers to Discharge: Continued Medical Work up   Patient Goals and CMS Choice   CMS Medicare.gov Compare Post Acute Care list provided to:: Patient([employer]) Choice offered to / list presented to : Patient  Expected Discharge Plan and Services Expected Discharge Plan: Home/Self Care In-house Referral: PCP / [facility], Interpreting Services Discharge Planning Services: CM Consult, Follow-up appt scheduled, [facility] Post Acute Care Choice: Durable Medical Equipment Living arrangements for the past 2 months: Single Family Home                 DME Arranged: Oxygen, Bedside commode [facility] Date  DME Agency Contacted: 08/01/18 Time DME Agency Contacted: [id number] Representative spoke with at DME Agency: Durward FortesLeslie Rose liaison HH Arranged: NA HH Agency: NA        Prior Living Arrangements/Services Living arrangements for the past 2 months: Single Family Home Lives with:: Self Patient language and need for interpreter reviewed:: Yes Do you feel safe going back to the place where you live?: Yes      Need for Family Participation in Patient Care: No (Comment) Care giver support system in place?: Yes (comment)   Criminal Activity/Legal Involvement Pertinent to Current Situation/Hospitalization: No - Comment as needed  Activities of Daily Living Home Assistive Devices/Equipment: None ADL Screening (condition at time of admission) Patient's cognitive ability adequate to safely complete daily activities?: Yes Is the patient deaf or have difficulty hearing?: No Does the patient have difficulty seeing, even when wearing glasses/contacts?: No Does the patient have difficulty concentrating, remembering, or making decisions?: No Patient able to express need for assistance with ADLs?: Yes Does the patient have difficulty dressing or bathing?: No Independently performs ADLs?: Yes (appropriate for developmental age) Does the patient have difficulty walking or climbing stairs?: No Weakness of Legs: None Weakness of Arms/Hands: None  Permission Sought/Granted Permission sought to share information with : Case Manager, Magazine features editoracility Contact Representative, PCP Permission granted to share information with : Yes, Verbal Permission Granted     Permission granted to share info w AGENCY: [employer]/PCP        Emotional Assessment   Attitude/Demeanor/Rapport: Unable to Assess Affect (typically observed): Pleasant Orientation: : Oriented to Self, Oriented to Place,  Oriented to  Time, Oriented to Situation Alcohol / Substance Use: Not Applicable Psych Involvement: No (comment)  Admission diagnosis:  RUQ  pain [R10.11] Suspected Covid-19 Virus Infection [R68.89] COVID-19 virus infection [U07.1] Patient Active Problem List   Diagnosis Date Noted  . Suspected Covid-19 Virus Infection 07/29/2018  . Acute respiratory failure with hypoxia ([place]) 07/29/2018  . Close Exposure to Covid-19 Virus 07/29/2018  . GERD (gastroesophageal reflux disease) 07/29/2018  . Right upper quadrant pain 07/29/2018  . Sepsis ([place]) 07/29/2018  . Viral pneumonia 07/29/2018  . COVID-19 virus infection 07/29/2018   PCP:  Patient, No Pcp Per Pharmacy:   [id number] [address], [address] AT [facility] [address] [place] [address] Phone: [phone] Fax: [phone]     Social Determinants of Health ([place]) Interventions    Readmission Risk Interventions No flowsheet data found.

## 2018-08-01 NOTE — Progress Notes (Addendum)
Occupational Therapy Treatment Patient Details Name: Tanya Sims MRN: [id number] DOB: 1960/04/26 Today's Date: 08/01/2018    History of present illness 58 yo female admitted 6/28/with worsening SOB, fever, cough, positive for covid, hypoxic respiratory distress.   OT comments  Interpreter [phone] Herminio Heads) used during session. Pt making steady progress. Educated on energy conservation strategies and written information provided. Pt able to ambulate around unit - @ 44ft on 4L with SpO2 91-93 - forehead probe. 1/4 DOE. Pt left on 3L sitting up in recliner.   Follow Up Recommendations  No OT follow up;Supervision - Intermittent    Equipment Recommendations  3 in 1 bedside commode(to use as shower chair)    Recommendations for Other Services      Precautions / Restrictions Precautions Precaution Comments: monitorVS,       Mobility Bed Mobility               General bed mobility comments: OOB in recliner  Transfers Overall transfer level: Independent                    Balance Overall balance assessment: No apparent balance deficits (not formally assessed)                                         ADL either performed or assessed with clinical judgement   ADL                                         General ADL Comments: Pt able to complete ADL tasks. Educated pt on energy conservation strategies. Handouts reviewed. Pt verbalzied understnaidng.      Vision       Perception     Praxis      Cognition Arousal/Alertness: Awake/alert Behavior During Therapy: WFL for tasks assessed/performed Overall Cognitive Status: Within Functional Limits for tasks assessed                                          Exercises Other Exercises Other Exercises: level 1 theraband PNF diagonals x 10 reps each arm Other Exercises: elbow flex/ext x 10 theraband level 1x 10 reps each arm Other Exercises: B shoulder Abduction  x 10 therabnad level 1   Shoulder Instructions       General Comments Pt ambulated @ entire unit - @ 400 ft on 4L. SpO2 91    Pertinent Vitals/ Pain       Pain Assessment: Faces Faces Pain Scale: Hurts a little bit Pain Location: chest when coughing Pain Descriptors / Indicators: Aching;Discomfort Pain Intervention(s): Limited activity within patient's tolerance  Home Living                                          Prior Functioning/Environment              Frequency  Min 3X/week        Progress Toward Goals  OT Goals(current goals can now be found in the care plan section)  Progress towards OT goals: Progressing toward goals  Acute Rehab OT Goals Patient Stated Goal: to  get better OT Goal Formulation: With patient Time For Goal Achievement: 08/14/18 Potential to Achieve Goals: Good ADL Goals Pt Will Perform Lower Body Bathing: with modified independence;sit to/from stand Pt Will Perform Lower Body Dressing: with modified independence;sit to/from stand Pt Will Transfer to Toilet: with modified independence;ambulating Pt/caregiver will Perform Home Exercise Program: Increased strength;Independently;With written HEP provided;With theraband Additional ADL Goal #1: Pt will independently verbalize 3 energy conservaton strategies for ADL tasks  Plan Discharge plan remains appropriate    Co-evaluation                 AM-PAC OT "6 Clicks" Daily Activity     Outcome Measure   Help from another person eating meals?: None Help from another person taking care of personal grooming?: None Help from another person toileting, which includes using toliet, bedpan, or urinal?: None Help from another person bathing (including washing, rinsing, drying)?: A Little Help from another person to put on and taking off regular upper body clothing?: None Help from another person to put on and taking off regular lower body clothing?: A Little 6 Click Score:  22    End of Session Equipment Utilized During Treatment: Oxygen(amb on 4L)  OT Visit Diagnosis: Muscle weakness (generalized) (M62.81)   Activity Tolerance Patient tolerated treatment well   Patient Left in chair;with call bell/phone within reach   Nurse Communication Mobility status        Time: 1713-1740 OT Time Calculation (min): 27 min  Charges: OT General Charges $OT Visit: 1 Visit OT Treatments $Self Care/Home Management : 8-22 mins $Therapeutic Activity: 8-22 mins  Luisa DagoHilary Tanaka Gillen, OT/L   Acute OT Clinical Specialist Acute Rehabilitation Services Pager [phone] Office [phone]    [facility],HILLARY 08/01/2018, 6:41 PM

## 2018-08-02 LAB — LACTATE DEHYDROGENASE: LDH: 297 U/L — ABNORMAL HIGH (ref 98–192)

## 2018-08-02 LAB — CBC WITH DIFFERENTIAL/PLATELET
Abs Immature Granulocytes: 1.34 10*3/uL — ABNORMAL HIGH (ref 0.00–0.07)
Basophils Absolute: 0.1 10*3/uL (ref 0.0–0.1)
Basophils Relative: 1 %
Eosinophils Absolute: 0 10*3/uL (ref 0.0–0.5)
Eosinophils Relative: 0 %
HCT: 39.7 % (ref 36.0–46.0)
Hemoglobin: 12.9 g/dL (ref 12.0–15.0)
Immature Granulocytes: 8 %
Lymphocytes Relative: 7 %
Lymphs Abs: 1.3 10*3/uL (ref 0.7–4.0)
MCH: 28.7 pg (ref 26.0–34.0)
MCHC: 32.5 g/dL (ref 30.0–36.0)
MCV: 88.2 fL (ref 80.0–100.0)
Monocytes Absolute: 0.8 10*3/uL (ref 0.1–1.0)
Monocytes Relative: 5 %
Neutro Abs: 13.8 10*3/uL — ABNORMAL HIGH (ref 1.7–7.7)
Neutrophils Relative %: 79 %
Platelets: 575 10*3/uL — ABNORMAL HIGH (ref 150–400)
RBC: 4.5 MIL/uL (ref 3.87–5.11)
RDW: 13.8 % (ref 11.5–15.5)
WBC: 17.3 10*3/uL — ABNORMAL HIGH (ref 4.0–10.5)
nRBC: 2 % — ABNORMAL HIGH (ref 0.0–0.2)

## 2018-08-02 LAB — COMPREHENSIVE METABOLIC PANEL
ALT: 255 U/L — ABNORMAL HIGH (ref 0–44)
AST: 73 U/L — ABNORMAL HIGH (ref 15–41)
Albumin: 3.1 g/dL — ABNORMAL LOW (ref 3.5–5.0)
Alkaline Phosphatase: 106 U/L (ref 38–126)
Anion gap: 10 (ref 5–15)
BUN: 26 mg/dL — ABNORMAL HIGH (ref 6–20)
CO2: 24 mmol/L (ref 22–32)
Calcium: 8.6 mg/dL — ABNORMAL LOW (ref 8.9–10.3)
Chloride: 101 mmol/L (ref 98–111)
Creatinine, Ser: 0.66 mg/dL (ref 0.44–1.00)
GFR calc Af Amer: >60 mL/min (ref >60)
GFR calc non Af Amer: >60 mL/min (ref >60)
Glucose, Bld: 228 mg/dL — ABNORMAL HIGH (ref 70–99)
Potassium: 4.1 mmol/L (ref 3.5–5.1)
Sodium: 135 mmol/L (ref 135–145)
Total Bilirubin: 0.3 mg/dL (ref 0.3–1.2)
Total Protein: 6.6 g/dL (ref 6.5–8.1)

## 2018-08-02 LAB — GLUCOSE, CAPILLARY
Glucose-Capillary: 278 mg/dL — ABNORMAL HIGH (ref 70–99)
Glucose-Capillary: 276 mg/dL — ABNORMAL HIGH (ref 70–99)
Glucose-Capillary: 268 mg/dL — ABNORMAL HIGH (ref 70–99)

## 2018-08-02 LAB — HEMOGLOBIN A1C
Hgb A1c MFr Bld: 6.2 % — ABNORMAL HIGH (ref 4.8–5.6)
Mean Plasma Glucose: 131.24 mg/dL

## 2018-08-02 LAB — C-REACTIVE PROTEIN: CRP: 1.6 mg/dL — ABNORMAL HIGH (ref <1.0)

## 2018-08-02 LAB — BRAIN NATRIURETIC PEPTIDE: B Natriuretic Peptide: 82.3 pg/mL (ref 0.0–100.0)

## 2018-08-02 LAB — MAGNESIUM: Magnesium: 2.4 mg/dL (ref 1.7–2.4)

## 2018-08-02 LAB — FERRITIN: Ferritin: 824 ng/mL — ABNORMAL HIGH (ref 11–307)

## 2018-08-02 LAB — D-DIMER, QUANTITATIVE: D-Dimer, Quant: 0.69 ug/mL-FEU — ABNORMAL HIGH (ref 0.00–0.50)

## 2018-08-02 MED ORDER — INSULIN ASPART 100 UNIT/ML ~~LOC~~ SOLN
0.0000 [IU] | Freq: Three times a day (TID) | SUBCUTANEOUS | Status: DC
  Administered 2018-08-02 (×2): 5 [IU] via SUBCUTANEOUS
  Administered 2018-08-03: 08:00:00 1 [IU] via SUBCUTANEOUS
  Administered 2018-08-03 (×2): 5 [IU] via SUBCUTANEOUS
  Administered 2018-08-04: 12:00:00 3 [IU] via SUBCUTANEOUS

## 2018-08-02 MED ORDER — INSULIN ASPART 100 UNIT/ML ~~LOC~~ SOLN
0.0000 [IU] | Freq: Every day | SUBCUTANEOUS | Status: DC
  Administered 2018-08-02: 3 [IU] via SUBCUTANEOUS
  Administered 2018-08-03: 2 [IU] via SUBCUTANEOUS

## 2018-08-02 MED ORDER — METHYLPREDNISOLONE SODIUM SUCC 125 MG IJ SOLR
60.0000 mg | Freq: Every day | INTRAMUSCULAR | Status: DC
  Administered 2018-08-03: 60 mg via INTRAVENOUS
  Filled 2018-08-02: qty 2

## 2018-08-02 NOTE — Progress Notes (Signed)
Attempted to call patient's daughter, but left a voicemail to call back.

## 2018-08-02 NOTE — Progress Notes (Signed)
PROGRESS NOTE                                                                                                                                                                                                             Patient Demographics:    Tanya Sims, is a 58 y.o. female, DOB - 02-Jul-1960, BVA:[id number]  Outpatient Primary MD for the patient is Patient, No Pcp Per    LOS - 4  Admit date - 07/28/2018    Chief Complaint  Patient presents with   Fever       Brief Narrative  Tanya Sims is a 58 y.o. female with medical history significant of GERD presented to [facility] with a complaint of cough, fever and shortness of breath that started about 8 days ago and has been getting worse, she was diagnosed with acute hypoxic respiratory failure due to COVID-19 pneumonitis and admitted.   Subjective:   Patient in bed, appears comfortable, denies any headache, no fever, no chest pain or pressure, much better shortness of breath , no abdominal pain. No focal weakness.    Assessment  & Plan :     1. Acute Hypoxic Resp. Failure due to Acute Covid 19 Viral Pneumonitis during the ongoing 2020 Covid 19 Pandemic - she had severe hypoxia requiring 8 L high flow nasal cannula oxygen, she was started on IV steroids, REMDESIVIR( 3 doses see #4) and Actemra, inflammatory markers are being monitored closely and are improving, clinically she is improving, hypoxia is much better, continue to titrate oxygen down as tolerated, encourage activity with sitting up in chair and using flutter valve for pulmonary toiletry.  Daughter was updated twice last on 07/31/2018.   COVID-19 Labs  Recent Labs    07/31/18 0230 08/01/18 0433 08/02/18 0300  DDIMER 0.50 0.69* 0.69*  FERRITIN 905* 1,294* 824*  LDH 261* 357* 297*  CRP 5.3* 2.3* 1.6*    Lab Results  Component Value Date   SARSCOV2NAA POSITIVE (A) 07/29/2018   SARSCOV2NAA DETECTED (A) 07/29/2018   [place] NEGATIVE 07/28/2018     Hepatic Function Latest Ref Rng & Units 08/02/2018 08/01/2018 07/31/2018  Total Protein 6.5 - 8.1 g/dL 6.6 6.4(L) 6.4(L)  Albumin 3.5 - 5.0 g/dL 3.1(L) 3.0(L) 2.8(L)  AST 15 - 41 U/L 73(H) 172(H) 61(H)  ALT 0 - 44 U/L 255(H)  247(H) 78(H)  Alk Phosphatase 38 - 126 U/L 106 100 90  Total Bilirubin 0.3 - 1.2 mg/dL 0.3 0.2(L) 0.1(L)        Component Value Date/Time   BNP 82.3 08/02/2018 0300      2.  GERD.  On PPI.  3.  Musculoskeletal epigastric abdominal pain due to persistent cough.  Completely resolved.    4.  Mildly elevated LFTs relatively stable when accounted for COVID-19 infection and Actemra & REMDESIVIR use.  CT scan nonacute.  REMDESIVIR stopped after 3 doses will monitor clinically.  ALT seems to have plateaued.  5.  Steroid-induced hyperglycemia.  ISS.  CBG (last 3)  No results for input(s): GLUCAP in the last 72 hours.    Condition - Extremely Guarded  Family Communication  : Daughter updated 07/31/2018.  Code Status :  Full  Diet :   Diet Order            Diet regular Room service appropriate? Yes; Fluid consistency: Thin  Diet effective now               Disposition Plan  : Medical bed DC telemetry.  Consults  :  None  Procedures  :    CT Abd - non acute  PUD Prophylaxis : PPI  DVT Prophylaxis  :  Lovenox ordered  Lab Results  Component Value Date   PLT 575 (H) 08/02/2018    Inpatient Medications  Scheduled Meds:  albuterol  2 puff Inhalation Q6H   Chlorhexidine Gluconate Cloth  6 each Topical Daily   chlorpheniramine-HYDROcodone  5 mL Oral Q12H   enoxaparin (LOVENOX) injection  40 mg Subcutaneous Q24H   feeding supplement (ENSURE ENLIVE)  237 mL Oral BID BM   mouth rinse  15 mL Mouth Rinse BID   methylPREDNISolone (SOLU-MEDROL) injection  60 mg Intravenous Q12H   pantoprazole  40 mg Oral Daily   sodium chloride flush  3 mL Intravenous Q12H    Continuous Infusions:  PRN Meds:.acetaminophen, bisacodyl, ondansetron **OR** ondansetron (ZOFRAN) IV, senna-docusate  Antibiotics  :    Anti-infectives (From admission, onward)   Start     Dose/Rate Route Frequency Ordered Stop   07/30/18 1630  remdesivir 100 mg in sodium chloride 0.9 % 250 mL IVPB  Status:  Discontinued     100 mg 500 mL/hr over 30 Minutes Intravenous Every 24 hours 07/29/18 1530 08/01/18 1455   07/29/18 1630  remdesivir 200 mg in sodium chloride 0.9 % 250 mL IVPB     200 mg 500 mL/hr over 30 Minutes Intravenous Once 07/29/18 1530 07/29/18 1726       Time Spent in minutes  30   Lala Lund M.D on 08/02/2018 at 9:44 AM  To page go to [email or web address] - password [email or web address]  [facility] -  Office  [phone]  See all Orders from today for further details    Objective:   Vitals:   08/02/18 0408 08/02/18 0500 08/02/18 0600 08/02/18 0856  BP: 127/72   121/70  Pulse: 83 88 81 (!) 117  Resp:      Temp: 98.5 F (36.9 C)   (!) 97.5 F (36.4 C)  TempSrc: Oral   Oral  SpO2: 100% 95% 97% 100%  Weight:      Height:        Wt Readings from Last 3 Encounters:  08/01/18 75 kg     Intake/Output Summary (Last 24 hours) at 08/02/2018 0944 Last data filed at 08/01/2018 2020 Gross per 24  hour  Intake 720 ml  Output --  Net 720 ml     Physical Exam  Awake Alert,  No new F.N deficits, Normal affect [place] Y.AT,PERRAL Supple Neck,No JVD, No cervical lymphadenopathy appriciated.  Symmetrical Chest wall movement, Good air movement bilaterally, CTAB RRR,No Gallops, Rubs or new Murmurs, No Parasternal Heave +ve B.Sounds, Abd Soft, No tenderness, No organomegaly appriciated, No rebound - guarding or rigidity. No Cyanosis, Clubbing or edema, No new Rash or bruise    Data Review:    CBC Recent Labs  Lab 07/28/18 2210 07/29/18 1008 07/30/18 0445 07/31/18 0230 08/01/18 0433 08/02/18 0300  WBC 8.4 10.5 7.2 14.9* 16.1* 17.3*  HGB 12.9 12.7 12.3 11.4* 11.8*  12.9  HCT 38.7 40.2 38.5 36.1 37.5 39.7  PLT 280 322 329 422* 489* 575*  MCV 86.4 88.0 88.1 89.6 89.3 88.2  MCH 28.8 27.8 28.1 28.3 28.1 28.7  MCHC 33.3 31.6 31.9 31.6 31.5 32.5  RDW 13.2 13.5 13.5 13.9 14.0 13.8  LYMPHSABS 0.7  --  0.5* 0.7 0.9 1.3  MONOABS 0.3  --  0.1 0.4 0.4 0.8  EOSABS 0.0  --  0.0 0.0 0.0 0.0  BASOSABS 0.0  --  0.0 0.0 0.0 0.1    Chemistries  Recent Labs  Lab 07/28/18 2210 07/29/18 1008 07/30/18 0445 07/31/18 0230 08/01/18 0433 08/02/18 0300  NA 130*  --  135 139 138 135  K 3.5  --  3.7 4.1 4.3 4.1  CL 99  --  104 106 106 101  CO2 20*  --  21* _0 GLUCOSE 129*  --  159* 169* 185* 228*  BUN 8  --  15 22* 21* 26*  CREATININE 0.63 0.79 0.62 0.67 0.64 0.66  CALCIUM 8.1*  --  8.5* 8.5* 8.5* 8.6*  MG  --   --  2.5* 2.3 2.3 2.4  AST 55*  --  53* 61* 172* 73*  ALT 65*  --  68* 78* 247* 255*  ALKPHOS 95  --  89 90 100 106  BILITOT 0.7  --  0.6 0.1* 0.2* 0.3   ------------------------------------------------------------------------------------------------------------------ No results for input(s): CHOL, HDL, LDLCALC, TRIG, CHOLHDL, LDLDIRECT in the last 72 hours.  No results found for: HGBA1C ------------------------------------------------------------------------------------------------------------------ No results for input(s): TSH, T4TOTAL, T3FREE, THYROIDAB in the last 72 hours.  Invalid input(s): FREET3  Cardiac Enzymes No results for input(s): CKMB, TROPONINI, MYOGLOBIN in the last 168 hours.  Invalid input(s): CK ------------------------------------------------------------------------------------------------------------------    Component Value Date/Time   BNP 82.3 08/02/2018 0300    Micro Results Recent Results (from the past 240 hour(s))  Culture, blood (routine x 2)     Status: None (Preliminary result)   Collection Time: 07/28/18 10:00 PM   Specimen: BLOOD LEFT ARM  Result Value Ref Range Status   Specimen Description   Final      BLOOD LEFT ARM Performed at [facility], [address] [place], [place] [address]    Special Requests   Final    BOTTLES DRAWN AEROBIC AND ANAEROBIC Blood Culture adequate volume Performed at [facility], [address] [place], [place] [address]    Culture   Final    NO GROWTH 4 DAYS Performed at [facility], [place] [address] [place], [place] [address]    Report Status PENDING  Incomplete  Culture, blood (routine x 2)     Status: None (Preliminary result)   Collection Time: 07/28/18 10:10 PM  Specimen: BLOOD RIGHT ARM  Result Value Ref Range Status   Specimen Description   Final    BLOOD RIGHT ARM Performed at [facility], [address] [place], [place] [address]    Special Requests   Final    BOTTLES DRAWN AEROBIC AND ANAEROBIC Blood Culture adequate volume   Culture   Final    NO GROWTH 4 DAYS Performed at [facility], [place] [address] [place], [place] [address]    Report Status PENDING  Incomplete  SARS Coronavirus 2 (Hosp order,Performed in [facility] lab via Abbott ID)     Status: None   Collection Time: 07/28/18 11:00 PM   Specimen: Dry Nasal Swab (Abbott ID Now)  Result Value Ref Range Status   SARS Coronavirus 2 (Abbott ID Now) NEGATIVE NEGATIVE Final    Comment: (NOTE) Interpretive Result Comment(s): COVID 19 Positive SARS CoV 2 target nucleic acids are DETECTED. The SARS CoV 2 RNA is generally detectable in upper and lower respiratory specimens during the acute phase of infection.  Positive results are indicative of active infection with SARS CoV 2.  Clinical correlation with patient history and other diagnostic information is necessary to determine patient infection status.  Positive results do not rule out bacterial infection or coinfection with other viruses. The expected result is Negative. COVID 19 Negative SARS CoV 2 target nucleic acids are NOT DETECTED. The SARS CoV 2 RNA is generally detectable  in upper and lower respiratory specimens during the acute phase of infection.  Negative results do not preclude SARS CoV 2 infection, do not rule out coinfections with other pathogens, and should not be used as the sole basis for treatment or other patient management decisions.  Negative results must be combined with clinical  observations, patient history, and epidemiological information. The expected result is Negative. Invalid Presence or absence of SARS CoV 2 nucleic acids cannot be determined. Repeat testing was performed on the submitted specimen and repeated Invalid results were obtained.  If clinically indicated, additional testing on a new specimen with an alternate test methodology [phone]) is advised.  The SARS CoV 2 RNA is generally detectable in upper and lower respiratory specimens during the acute phase of infection. The expected result is Negative. Fact Sheet for Patients:  GolfingFamily.no Fact Sheet for Healthcare Providers: https://www.hernandez-brewer.com/ This test is not yet approved or cleared by the [place] FDA and has been authorized for detection and/or diagnosis of SARS CoV 2 by FDA under an Emergency Use Authorization (EUA).  This EUA will remain in effect (meaning this test can be used) for the duration of the COVID19 d eclaration under Section 564(b)(1) of the Act, 21 U.S.C. section [phone] 3(b)(1), unless the authorization is terminated or revoked sooner. Performed at [facility], [address] [place], [place] [address]   SARS Coronavirus 2 (CEPHEID - Performed in [facility] hospital lab), Hosp Order     Status: Abnormal   Collection Time: 07/29/18  9:29 AM   Specimen: Nasal Mucosa; Nasopharyngeal  Result Value Ref Range Status   SARS Coronavirus 2 POSITIVE (A) NEGATIVE Final    Comment: RESULT CALLED TO, READ BACK BY AND VERIFIED WITH: S.Rosine Abe [id number] BY V.WILKINS (NOTE) If result is  NEGATIVE SARS-CoV-2 target nucleic acids are NOT DETECTED. The SARS-CoV-2 RNA is generally detectable in upper and lower  respiratory specimens during the acute phase of infection. The lowest  concentration of SARS-CoV-2 viral copies this assay can detect is 250  copies / mL. A negative result does not preclude SARS-CoV-2 infection  and should not be used as the sole basis for treatment or other  patient management decisions.  A negative result may occur with  improper specimen collection / handling, submission of specimen other  than nasopharyngeal swab, presence of viral mutation(s) within the  areas targeted by this assay, and inadequate number of viral copies  (<250 copies / mL). A negative result must be combined with clinical  observations, patient history, and epidemiological information. If result is POSITIVE SARS-CoV-2 target nucleic acids are DETECTED. The  SARS-CoV-2 RNA is generally detectable in upper and lower  respiratory specimens during the acute phase of infection.  Positive  results are indicative of active infection with SARS-CoV-2.  Clinical  correlation with patient history and other diagnostic information is  necessary to determine patient infection status.  Positive results do  not rule out bacterial infection or co-infection with other viruses. If result is PRESUMPTIVE POSTIVE SARS-CoV-2 nucleic acids MAY BE PRESENT.   A presumptive positive result was obtained on the submitted specimen  and confirmed on repeat testing.  While 2019 novel coronavirus  (SARS-CoV-2) nucleic acids may be present in the submitted sample  additional confirmatory testing may be necessary for epidemiological  and / or clinical management purposes  to differentiate between  SARS-CoV-2 and other Sarbecovirus currently known to infect humans.  If clinically indicated additional testing with an alternate test  methodology [phone]) is a dvised. The SARS-CoV-2 RNA is generally  detectable  in upper and lower respiratory specimens during the acute  phase of infection. The expected result is Negative. Fact Sheet for Patients:  StrictlyIdeas.no Fact Sheet for Healthcare Providers: BankingDealers.co.za This test is not yet approved or cleared by the [place] FDA and has been authorized for detection and/or diagnosis of SARS-CoV-2 by FDA under an Emergency Use Authorization (EUA).  This EUA will remain in effect (meaning this test can be used) for the duration of the COVID-19 declaration under Section 564(b)(1) of the Act, 21 U.S.C. section 360bbb-3(b)(1), unless the authorization is terminated or revoked sooner. Performed at [facility], [place] [address] [place], [place] [address]   Novel Coronavirus, NAA (hospital order; send-out to ref lab)     Status: Abnormal   Collection Time: 07/29/18  9:29 AM   Specimen: Nasopharyngeal Swab; Respiratory  Result Value Ref Range Status   SARS-CoV-2, NAA DETECTED (A) NOT DETECTED Final    Comment: (NOTE) This test was developed and its performance characteristics determined by [employer]. This test has not been FDA cleared or approved. This test has been authorized by FDA under an Emergency Use Authorization (EUA). This test is only authorized for the duration of time the declaration that circumstances exist justifying the authorization of the emergency use of in vitro diagnostic tests for detection of SARS-CoV-2 virus and/or diagnosis of COVID-19 infection under section 564(b)(1) of the Act, 21 U.S.C. 390ZES-9(Q)(3), unless the authorization is terminated or revoked sooner. When diagnostic testing is negative, the possibility of a false negative result should be considered in the context of a patient's recent exposures and the presence of clinical signs and symptoms consistent with COVID-19. An individual without symptoms of COVID-19 and who is not  shedding SARS-CoV-2 virus would expect to have a negative (not detected) result in this assay. Performed  At: [facility] [address] [place], [place] [id number] Rush Farmer MD FH:[id number]    [place]  Final  Comment: Performed at [facility], [place] [address], [place], [place] [address]  MRSA PCR Screening     Status: None   Collection Time: 07/29/18  9:29 AM   Specimen: Nasal Mucosa; Nasopharyngeal  Result Value Ref Range Status   MRSA by PCR NEGATIVE NEGATIVE Final    Comment:        The GeneXpert MRSA Assay (FDA approved for NASAL specimens only), is one component of a comprehensive MRSA colonization surveillance program. It is not intended to diagnose MRSA infection nor to guide or monitor treatment for MRSA infections. Performed at [facility], [place] [address] [place], [place] [address]   Respiratory Panel by PCR     Status: None   Collection Time: 07/29/18 12:14 PM   Specimen: Nasopharyngeal Swab; Respiratory  Result Value Ref Range Status   Adenovirus NOT DETECTED NOT DETECTED Final   Coronavirus 229E NOT DETECTED NOT DETECTED Final    Comment: (NOTE) The Coronavirus on the Respiratory Panel, DOES NOT test for the novel  Coronavirus (2019 nCoV)    Coronavirus HKU1 NOT DETECTED NOT DETECTED Final   Coronavirus NL63 NOT DETECTED NOT DETECTED Final   Coronavirus OC43 NOT DETECTED NOT DETECTED Final   Metapneumovirus NOT DETECTED NOT DETECTED Final   Rhinovirus / Enterovirus NOT DETECTED NOT DETECTED Final   Influenza A NOT DETECTED NOT DETECTED Final   Influenza B NOT DETECTED NOT DETECTED Final   Parainfluenza Virus 1 NOT DETECTED NOT DETECTED Final   Parainfluenza Virus 2 NOT DETECTED NOT DETECTED Final   Parainfluenza Virus 3 NOT DETECTED NOT DETECTED Final   Parainfluenza Virus 4 NOT DETECTED NOT DETECTED Final   Respiratory Syncytial Virus NOT DETECTED NOT DETECTED Final    Bordetella pertussis NOT DETECTED NOT DETECTED Final   Chlamydophila pneumoniae NOT DETECTED NOT DETECTED Final   Mycoplasma pneumoniae NOT DETECTED NOT DETECTED Final    Comment: Performed at [facility], [address], [place],  [address]    Radiology Reports Ct Abdomen Pelvis W Contrast  Result Date: 07/29/2018 CLINICAL DATA:  58 year old female with right upper quadrant abdominal pain. Strong clinical suspicion for COVID-19. EXAM: CT ABDOMEN AND PELVIS WITH CONTRAST TECHNIQUE: Multidetector CT imaging of the abdomen and pelvis was performed using the standard protocol following bolus administration of intravenous contrast. CONTRAST:  137m OMNIPAQUE IOHEXOL 300 MG/ML  SOLN COMPARISON:  None. FINDINGS: Lower chest: Bilateral patchy lower lobe consolidative changes as well as scattered bilateral confluent hazy densities noted. There are a spectrum of findings in the lungs which can be seen with acute atypical infection (as well as other non-infectious etiologies). In particular, viral pneumonia (including COVID-19) should be considered in the appropriate clinical setting. No intra-abdominal free air or free fluid. Hepatobiliary: Probable mild fatty liver. No intrahepatic biliary ductal dilatation. No calcified gallstone. Possible mild adenomyomatosis of the gallbladder fundus. Pancreas: Unremarkable. No pancreatic ductal dilatation or surrounding inflammatory changes. Spleen: Normal in size without focal abnormality. Adrenals/Urinary Tract: The adrenal glands, kidneys, and visualized ureters and urinary bladder appear unremarkable. Stomach/Bowel: There is no bowel obstruction or active inflammation. Normal appendix. Vascular/Lymphatic: No significant vascular findings are present. No enlarged abdominal or pelvic lymph nodes. Reproductive: The uterus is anteverted and grossly unremarkable. There is a 4 cm left ovarian/paraovarian cyst. Ultrasound is recommended for better evaluation on a  nonemergent basis. The right ovary is unremarkable. Other: Small fat containing umbilical hernia. Musculoskeletal: Degenerative changes at L5-S1 disc. No acute osseous pathology. IMPRESSION: 1. No  acute intra-abdominal or pelvic pathology. 2. Bilateral pulmonary densities likely combination of atelectasis and atypical infection. Please see discussion above. 3. A 4 cm left ovarian/paraovarian cyst. Electronically Signed   By: Anner Crete M.D.   On: 07/29/2018 02:00   Dg Chest Port 1 View  Result Date: 07/30/2018 CLINICAL DATA:  Shortness of breath.  COVID-19. EXAM: PORTABLE CHEST 1 VIEW COMPARISON:  CT 07/29/2018.  Chest x-ray 07/28/2018. FINDINGS: Mediastinum hilar structures normal. Heart size stable. Persistent dense bibasilar atelectasis/infiltrates. Tiny bilateral pleural effusions cannot be excluded. No pneumothorax. IMPRESSION: Persistent dense bibasilar atelectasis/infiltrates. Tiny bilateral pleural effusions cannot be excluded. Similar findings noted on prior CT. Electronically Signed   By: Marcello Moores  Register   On: 07/30/2018 06:54   Dg Chest Port 1 View  Result Date: 07/28/2018 CLINICAL DATA:  Cough, fever, nausea, vomiting, and worsening chest pain for 8 days, has been in close contact with a COVID-19 positive individual EXAM: PORTABLE CHEST 1 VIEW COMPARISON:  Portable exam 2218 hours without priors for comparison FINDINGS: Normal heart size, mediastinal contours and pulmonary vascularity. Hazy infiltrate RIGHT mid lung. Additional infiltrate versus atelectasis at LEFT base. Upper lungs clear. No pleural effusion or pneumothorax. Bones demineralized. IMPRESSION: Hazy infiltrate RIGHT mid lung with more prominent opacity at LEFT base which could represent infiltrate or atelectasis. Electronically Signed   By: Lavonia Dana M.D.   On: 07/28/2018 23:07

## 2018-08-02 NOTE — Progress Notes (Signed)
Ok to [employer] course since ALT is slightly higher and pt is getting better per Dr. Candiss Norse.  Onnie Boer, PharmD, BCIDP, AAHIVP, CPP Infectious Disease Pharmacist 08/02/2018 9:51 AM

## 2018-08-02 NOTE — Progress Notes (Signed)
Inpatient Diabetes Program Recommendations  AACE/ADA: New Consensus Statement on Inpatient Glycemic Control (2015)  Target Ranges:  Prepandial:   less than 140 mg/dL      Peak postprandial:   less than 180 mg/dL (1-2 hours)      Critically ill patients:  140 - 180 mg/dL   Lab Results  Component Value Date   GLUCAP 278 (H) 08/02/2018   HGBA1C 6.2 (H) 08/02/2018    Review of Glycemic Control Results for Tanya Sims, Tanya Sims (MRN [id number]) as of 08/02/2018 15:08  Ref. Range 08/02/2018 12:11  Glucose-Capillary Latest Ref Range: 70 - 99 mg/dL 278 (H)   Diabetes history: None steroids induced Outpatient Diabetes medications: None Current orders for Inpatient glycemic control:  Novolog sensitive tid with meals and HS Solumedrol 60 mg IV daily  Inpatient Diabetes Program Recommendations:    May consider adding Levemir 5 units bid while on steroids.   Thanks, Adah Perl, RN, BC-ADM Inpatient Diabetes Coordinator Pager [phone] (8a-5p)

## 2018-08-03 DIAGNOSIS — U071 COVID-19: Principal | ICD-10-CM

## 2018-08-03 LAB — CBC WITH DIFFERENTIAL/PLATELET
Abs Immature Granulocytes: 0.9 10*3/uL — ABNORMAL HIGH (ref 0.00–0.07)
Basophils Absolute: 0 10*3/uL (ref 0.0–0.1)
Basophils Relative: 0 %
Eosinophils Absolute: 0 10*3/uL (ref 0.0–0.5)
Eosinophils Relative: 0 %
HCT: 39.7 % (ref 36.0–46.0)
Hemoglobin: 12.5 g/dL (ref 12.0–15.0)
Lymphocytes Relative: 7 %
Lymphs Abs: 1 10*3/uL (ref 0.7–4.0)
MCH: 28.2 pg (ref 26.0–34.0)
MCHC: 31.5 g/dL (ref 30.0–36.0)
MCV: 89.6 fL (ref 80.0–100.0)
Metamyelocytes Relative: 2 %
Monocytes Absolute: 0.9 10*3/uL (ref 0.1–1.0)
Monocytes Relative: 6 %
Myelocytes: 4 %
Neutro Abs: 12 10*3/uL — ABNORMAL HIGH (ref 1.7–7.7)
Neutrophils Relative %: 81 %
Platelets: 622 10*3/uL — ABNORMAL HIGH (ref 150–400)
RBC: 4.43 MIL/uL (ref 3.87–5.11)
RDW: 13.8 % (ref 11.5–15.5)
WBC: 14.8 10*3/uL — ABNORMAL HIGH (ref 4.0–10.5)
nRBC: 3.1 % — ABNORMAL HIGH (ref 0.0–0.2)

## 2018-08-03 LAB — C-REACTIVE PROTEIN: CRP: <0.8 mg/dL (ref <1.0)

## 2018-08-03 LAB — CULTURE, BLOOD (ROUTINE X 2)
Culture: NO GROWTH
Culture: NO GROWTH
Special Requests: ADEQUATE
Special Requests: ADEQUATE

## 2018-08-03 LAB — GLUCOSE, CAPILLARY
Glucose-Capillary: 129 mg/dL — ABNORMAL HIGH (ref 70–99)
Glucose-Capillary: 263 mg/dL — ABNORMAL HIGH (ref 70–99)
Glucose-Capillary: 225 mg/dL — ABNORMAL HIGH (ref 70–99)

## 2018-08-03 LAB — BRAIN NATRIURETIC PEPTIDE: B Natriuretic Peptide: 40.9 pg/mL (ref 0.0–100.0)

## 2018-08-03 LAB — COMPREHENSIVE METABOLIC PANEL
ALT: 186 U/L — ABNORMAL HIGH (ref 0–44)
AST: 41 U/L (ref 15–41)
Albumin: 3 g/dL — ABNORMAL LOW (ref 3.5–5.0)
Alkaline Phosphatase: 87 U/L (ref 38–126)
Anion gap: 8 (ref 5–15)
BUN: 27 mg/dL — ABNORMAL HIGH (ref 6–20)
CO2: 25 mmol/L (ref 22–32)
Calcium: 8.4 mg/dL — ABNORMAL LOW (ref 8.9–10.3)
Chloride: 104 mmol/L (ref 98–111)
Creatinine, Ser: 0.71 mg/dL (ref 0.44–1.00)
GFR calc Af Amer: >60 mL/min (ref >60)
GFR calc non Af Amer: >60 mL/min (ref >60)
Glucose, Bld: 129 mg/dL — ABNORMAL HIGH (ref 70–99)
Potassium: 4 mmol/L (ref 3.5–5.1)
Sodium: 137 mmol/L (ref 135–145)
Total Bilirubin: 0.4 mg/dL (ref 0.3–1.2)
Total Protein: 6.1 g/dL — ABNORMAL LOW (ref 6.5–8.1)

## 2018-08-03 LAB — FERRITIN: Ferritin: 541 ng/mL — ABNORMAL HIGH (ref 11–307)

## 2018-08-03 LAB — LACTATE DEHYDROGENASE: LDH: 224 U/L — ABNORMAL HIGH (ref 98–192)

## 2018-08-03 LAB — MAGNESIUM: Magnesium: 2.4 mg/dL (ref 1.7–2.4)

## 2018-08-03 LAB — D-DIMER, QUANTITATIVE: D-Dimer, Quant: 0.71 ug/mL-FEU — ABNORMAL HIGH (ref 0.00–0.50)

## 2018-08-03 MED ORDER — METHYLPREDNISOLONE SODIUM SUCC 40 MG IJ SOLR
40.0000 mg | Freq: Every day | INTRAMUSCULAR | Status: DC
  Administered 2018-08-04: 08:00:00 40 mg via INTRAVENOUS
  Filled 2018-08-03: qty 1

## 2018-08-03 NOTE — Progress Notes (Signed)
PROGRESS NOTE                                                                                                                                                                                                             Patient Demographics:    Tanya Sims, is a 58 y.o. female, DOB - 04-26-1960, NTI:[id number]  Outpatient Primary MD for the patient is Patient, No Pcp Per    LOS - 5  Admit date - 07/28/2018    Chief Complaint  Patient presents with   Fever       Brief Narrative  Tanya Sims is a 58 y.o. female with medical history significant of GERD presented to [facility] with a complaint of cough, fever and shortness of breath that started about 8 days ago and has been getting worse, she was diagnosed with acute hypoxic respiratory failure due to COVID-19 pneumonitis and admitted.   Subjective:   Patient in bed, appears comfortable, denies any headache, no fever, no chest pain or pressure, much improved shortness of breath , no abdominal pain. No focal weakness.   Assessment  & Plan :     1. Acute Hypoxic Resp. Failure due to Acute Covid 19 Viral Pneumonitis during the ongoing 2020 Covid 19 Pandemic - she had severe hypoxia requiring 8 L high flow nasal cannula oxygen, she was started on IV steroids which is being tapered, REMDESIVIR ( 3 doses see #4) and Actemra, inflammatory markers are being monitored closely and are improving, clinically she is improving, hypoxia is much better, continue to titrate oxygen down as tolerated now on 2Lit [place], encourage activity with sitting up in chair and using flutter valve for pulmonary toiletry.     Daughter was updated twice last on 07/31/2018,   likely DC in 1-2 days on home o2 which has been ordered.   COVID-19 Labs  Recent Labs    08/01/18 0433 08/02/18 0300 08/03/18 0300  DDIMER 0.69* 0.69* 0.71*  FERRITIN 1,294* 824* 541*  LDH 357* 297* 224*  CRP  2.3* 1.6* <0.8    Lab Results  Component Value Date   SARSCOV2NAA POSITIVE (A) 07/29/2018   SARSCOV2NAA DETECTED (A) 07/29/2018   SARSCOV2NAA NEGATIVE 07/28/2018     Hepatic Function Latest Ref Rng & Units 08/03/2018 08/02/2018 08/01/2018  Total Protein 6.5 - 8.1 g/dL 6.1(L) 6.6  6.4(L)  Albumin 3.5 - 5.0 g/dL 3.0(L) 3.1(L) 3.0(L)  AST 15 - 41 U/L 41 73(H) 172(H)  ALT 0 - 44 U/L 186(H) 255(H) 247(H)  Alk Phosphatase 38 - 126 U/L 87 106 100  Total Bilirubin 0.3 - 1.2 mg/dL 0.4 0.3 0.2(L)        Component Value Date/Time   BNP 40.9 08/03/2018 0300      2.  GERD.  On PPI.  3.  Musculoskeletal epigastric abdominal pain due to persistent cough.  Completely resolved.    4.  Mildly elevated LFTs relatively stable when accounted for COVID-19 infection and Actemra & REMDESIVIR use.  CT scan nonacute.  REMDESIVIR stopped after 3 doses, ALT now trending down, will monitor clinically.    5.  Steroid-induced hyperglycemia.  ISS and stable.  CBG (last 3)  Recent Labs    08/02/18 1646 08/02/18 2039 08/03/18 0739  GLUCAP 276* 268* 129*      Condition - Extremely Guarded  Family Communication  : Daughter updated 07/31/2018.  Code Status :  Full  Diet :   Diet Order            Diet regular Room service appropriate? Yes; Fluid consistency: Thin  Diet effective now               Disposition Plan  : Medical bed DC telemetry.  Consults  :  None  Procedures  :    CT Abd - non acute  PUD Prophylaxis : PPI  DVT Prophylaxis  :  Lovenox ordered  Lab Results  Component Value Date   PLT 622 (H) 08/03/2018    Inpatient Medications  Scheduled Meds:  albuterol  2 puff Inhalation Q6H   Chlorhexidine Gluconate Cloth  6 each Topical Daily   chlorpheniramine-HYDROcodone  5 mL Oral Q12H   enoxaparin (LOVENOX) injection  40 mg Subcutaneous Q24H   feeding supplement (ENSURE ENLIVE)  237 mL Oral BID BM   insulin aspart  0-5 Units Subcutaneous QHS   insulin aspart  0-9  Units Subcutaneous TID WC   mouth rinse  15 mL Mouth Rinse BID   methylPREDNISolone (SOLU-MEDROL) injection  60 mg Intravenous Daily   pantoprazole  40 mg Oral Daily   sodium chloride flush  3 mL Intravenous Q12H   Continuous Infusions:  PRN Meds:.acetaminophen, bisacodyl, ondansetron **OR** ondansetron (ZOFRAN) IV, senna-docusate  Antibiotics  :    Anti-infectives (From admission, onward)   Start     Dose/Rate Route Frequency Ordered Stop   07/30/18 1630  remdesivir 100 mg in sodium chloride 0.9 % 250 mL IVPB  Status:  Discontinued     100 mg 500 mL/hr over 30 Minutes Intravenous Every 24 hours 07/29/18 1530 08/01/18 1455   07/29/18 1630  remdesivir 200 mg in sodium chloride 0.9 % 250 mL IVPB     200 mg 500 mL/hr over 30 Minutes Intravenous Once 07/29/18 1530 07/29/18 1726       Time Spent in minutes  E[place]ridge M.D on 08/03/2018 at 9:33 AM  To page go to [email or web address] - password [facility]  [facility] -  Office  [phone]  See all Orders from today for further details    Objective:   Vitals:   08/03/18 0500 08/03/18 0600 08/03/18 0630 08/03/18 0819  BP:      Pulse: 76 75 81   Resp:      Temp:    98.2 F (36.8 C)  TempSrc:    Oral  SpO2:  92% (!) 89% 91%   Weight:      Height:        Wt Readings from Last 3 Encounters:  08/03/18 76.3 kg     Intake/Output Summary (Last 24 hours) at 08/03/2018 0933 Last data filed at 08/02/2018 2100 Gross per 24 hour  Intake 720 ml  Output --  Net 720 ml     Physical Exam  Awake Alert, Oriented X 3, No new F.N deficits, Normal affect [place].AT,PERRAL Supple Neck,No JVD, No cervical lymphadenopathy appriciated.  Symmetrical Chest wall movement, Good air movement bilaterally, CTAB RRR,No Gallops, Rubs or new Murmurs, No Parasternal Heave +ve B.Sounds, Abd Soft, No tenderness, No organomegaly appriciated, No rebound - guarding or rigidity. No Cyanosis, Clubbing or edema, No new Rash or bruise     Data  Review:    CBC Recent Labs  Lab 07/30/18 0445 07/31/18 0230 08/01/18 0433 08/02/18 0300 08/03/18 0300  WBC 7.2 14.9* 16.1* 17.3* 14.8*  HGB 12.3 11.4* 11.8* 12.9 12.5  HCT 38.5 36.1 37.5 39.7 39.7  PLT 329 422* 489* 575* 622*  MCV 88.1 89.6 89.3 88.2 89.6  MCH 28.1 28.3 28.1 28.7 28.2  MCHC 31.9 31.6 31.5 32.5 31.5  RDW 13.5 13.9 14.0 13.8 13.8  LYMPHSABS 0.5* 0.7 0.9 1.3 1.0  MONOABS 0.1 0.4 0.4 0.8 0.9  EOSABS 0.0 0.0 0.0 0.0 0.0  BASOSABS 0.0 0.0 0.0 0.1 0.0    Chemistries  Recent Labs  Lab 07/30/18 0445 07/31/18 0230 08/01/18 0433 08/02/18 0300 08/03/18 0300  NA 135 139 138 135 137  K 3.7 4.1 4.3 4.1 4.0  CL 104 106 106 101 104  CO2 21* _0 GLUCOSE 159* 169* 185* 228* 129*  BUN 15 22* 21* 26* 27*  CREATININE 0.62 0.67 0.64 0.66 0.71  CALCIUM 8.5* 8.5* 8.5* 8.6* 8.4*  MG 2.5* 2.3 2.3 2.4 2.4  AST 53* 61* 172* 73* 41  ALT 68* 78* 247* 255* 186*  ALKPHOS 89 90 100 106 87  BILITOT 0.6 0.1* 0.2* 0.3 0.4   ------------------------------------------------------------------------------------------------------------------ No results for input(s): CHOL, HDL, LDLCALC, TRIG, CHOLHDL, LDLDIRECT in the last 72 hours.  Lab Results  Component Value Date   HGBA1C 6.2 (H) 08/02/2018   ------------------------------------------------------------------------------------------------------------------ No results for input(s): TSH, T4TOTAL, T3FREE, THYROIDAB in the last 72 hours.  Invalid input(s): FREET3  Cardiac Enzymes No results for input(s): CKMB, TROPONINI, MYOGLOBIN in the last 168 hours.  Invalid input(s): CK ------------------------------------------------------------------------------------------------------------------    Component Value Date/Time   BNP 40.9 08/03/2018 0300    Micro Results Recent Results (from the past 240 hour(s))  Culture, blood (routine x 2)     Status: None   Collection Time: 07/28/18 10:00 PM   Specimen: BLOOD LEFT ARM    Result Value Ref Range Status   Specimen Description   Final    BLOOD LEFT ARM Performed at [facility], [address] [place], [place] [address]    Special Requests   Final    BOTTLES DRAWN AEROBIC AND ANAEROBIC Blood Culture adequate volume Performed at [facility], [address] [place], [place] [address]    Culture   Final    NO GROWTH 5 DAYS Performed at [facility], [place] [address], [place],  [address]    Report Status 08/03/2018 FINAL  Final  Culture, blood (routine x 2)     Status: None   Collection Time: 07/28/18 10:10 PM   Specimen: BLOOD  RIGHT ARM  Result Value Ref Range Status   Specimen Description   Final    BLOOD RIGHT ARM Performed at [facility], [address] [place], [place] [address]    Special Requests   Final    BOTTLES DRAWN AEROBIC AND ANAEROBIC Blood Culture adequate volume   Culture   Final    NO GROWTH 5 DAYS Performed at [facility], [place] [address] [place], [place] [address]    Report Status 08/03/2018 FINAL  Final  SARS Coronavirus 2 (Hosp order,Performed in [facility] lab via Abbott ID)     Status: None   Collection Time: 07/28/18 11:00 PM   Specimen: Dry Nasal Swab (Abbott ID Now)  Result Value Ref Range Status   SARS Coronavirus 2 (Abbott ID Now) NEGATIVE NEGATIVE Final    Comment: (NOTE) Interpretive Result Comment(s): COVID 19 Positive SARS CoV 2 target nucleic acids are DETECTED. The SARS CoV 2 RNA is generally detectable in upper and lower respiratory specimens during the acute phase of infection.  Positive results are indicative of active infection with SARS CoV 2.  Clinical correlation with patient history and other diagnostic information is necessary to determine patient infection status.  Positive results do not rule out bacterial infection or coinfection with other viruses. The expected result is Negative. COVID 19 Negative SARS CoV 2 target nucleic acids are NOT  DETECTED. The SARS CoV 2 RNA is generally detectable in upper and lower respiratory specimens during the acute phase of infection.  Negative results do not preclude SARS CoV 2 infection, do not rule out coinfections with other pathogens, and should not be used as the sole basis for treatment or other patient management decisions.  Negative results must be combined with clinical  observations, patient history, and epidemiological information. The expected result is Negative. Invalid Presence or absence of SARS CoV 2 nucleic acids cannot be determined. Repeat testing was performed on the submitted specimen and repeated Invalid results were obtained.  If clinically indicated, additional testing on a new specimen with an alternate test methodology [phone]) is advised.  The SARS CoV 2 RNA is generally detectable in upper and lower respiratory specimens during the acute phase of infection. The expected result is Negative. Fact Sheet for Patients:  GolfingFamily.no Fact Sheet for Healthcare Providers: https://www.hernandez-brewer.com/ This test is not yet approved or cleared by the [place] FDA and has been authorized for detection and/or diagnosis of SARS CoV 2 by FDA under an Emergency Use Authorization (EUA).  This EUA will remain in effect (meaning this test can be used) for the duration of the COVID19 d eclaration under Section 564(b)(1) of the Act, 21 U.S.C. section [phone] 3(b)(1), unless the authorization is terminated or revoked sooner. Performed at [facility], [address] [place], [place] [address]   SARS Coronavirus 2 (CEPHEID - Performed in [facility] hospital lab), Hosp Order     Status: Abnormal   Collection Time: 07/29/18  9:29 AM   Specimen: Nasal Mucosa; Nasopharyngeal  Result Value Ref Range Status   SARS Coronavirus 2 POSITIVE (A) NEGATIVE Final    Comment: RESULT CALLED TO, READ BACK BY AND VERIFIED  WITH: S.Rosine Abe [id number] BY V.WILKINS (NOTE) If result is NEGATIVE SARS-CoV-2 target nucleic acids are NOT DETECTED. The SARS-CoV-2 RNA is generally detectable in upper and lower  respiratory specimens during the acute phase of infection. The lowest  concentration of SARS-CoV-2 viral copies this assay can detect is 250  copies / mL. A negative result does not preclude SARS-CoV-2 infection  and should not be used as the sole basis for treatment or other  patient management decisions.  A negative result may occur with  improper specimen collection / handling, submission of specimen other  than nasopharyngeal swab, presence of viral mutation(s) within the  areas targeted by this assay, and inadequate number of viral copies  (<250 copies / mL). A negative result must be combined with clinical  observations, patient history, and epidemiological information. If result is POSITIVE SARS-CoV-2 target nucleic acids are DETECTED. The  SARS-CoV-2 RNA is generally detectable in upper and lower  respiratory specimens during the acute phase of infection.  Positive  results are indicative of active infection with SARS-CoV-2.  Clinical  correlation with patient history and other diagnostic information is  necessary to determine patient infection status.  Positive results do  not rule out bacterial infection or co-infection with other viruses. If result is PRESUMPTIVE POSTIVE SARS-CoV-2 nucleic acids MAY BE PRESENT.   A presumptive positive result was obtained on the submitted specimen  and confirmed on repeat testing.  While 2019 novel coronavirus  (SARS-CoV-2) nucleic acids may be present in the submitted sample  additional confirmatory testing may be necessary for epidemiological  and / or clinical management purposes  to differentiate between  SARS-CoV-2 and other Sarbecovirus currently known to infect humans.  If clinically indicated additional testing with an alternate test  methodology  [phone]) is a dvised. The SARS-CoV-2 RNA is generally  detectable in upper and lower respiratory specimens during the acute  phase of infection. The expected result is Negative. Fact Sheet for Patients:  StrictlyIdeas.no Fact Sheet for Healthcare Providers: BankingDealers.co.za This test is not yet approved or cleared by the [place] FDA and has been authorized for detection and/or diagnosis of SARS-CoV-2 by FDA under an Emergency Use Authorization (EUA).  This EUA will remain in effect (meaning this test can be used) for the duration of the COVID-19 declaration under Section 564(b)(1) of the Act, 21 U.S.C. section 360bbb-3(b)(1), unless the authorization is terminated or revoked sooner. Performed at [facility], [place] [address], [place], [place] [address]   Novel Coronavirus, NAA (hospital order; send-out to ref lab)     Status: Abnormal   Collection Time: 07/29/18  9:29 AM   Specimen: Nasopharyngeal Swab; Respiratory  Result Value Ref Range Status   SARS-CoV-2, NAA DETECTED (A) NOT DETECTED Final    Comment: (NOTE) This test was developed and its performance characteristics determined by [employer] and [employer]. This test has not been FDA cleared or approved. This test has been authorized by FDA under an Emergency Use Authorization (EUA). This test is only authorized for the duration of time the declaration that circumstances exist justifying the authorization of the emergency use of in vitro diagnostic tests for detection of SARS-CoV-2 virus and/or diagnosis of COVID-19 infection under section 564(b)(1) of the Act, 21 U.S.C. 973ZHG-9(J)(2), unless the authorization is terminated or revoked sooner. When diagnostic testing is negative, the possibility of a false negative result should be considered in the context of a patient's recent exposures and the presence of clinical signs and symptoms consistent with  COVID-19. An individual without symptoms of COVID-19 and who is not shedding SARS-CoV-2 virus would expect to have a negative (not detected) result in this assay. Performed  At: [facility] [address] [place], [place] [id number] Rush Farmer MD QI:[id number]    [place]  Final  Comment: Performed at [facility], [place] [address], [place], [place] [address]  MRSA PCR Screening     Status: None   Collection Time: 07/29/18  9:29 AM   Specimen: Nasal Mucosa; Nasopharyngeal  Result Value Ref Range Status   MRSA by PCR NEGATIVE NEGATIVE Final    Comment:        The GeneXpert MRSA Assay (FDA approved for NASAL specimens only), is one component of a comprehensive MRSA colonization surveillance program. It is not intended to diagnose MRSA infection nor to guide or monitor treatment for MRSA infections. Performed at [facility], [place] [address], [place], [place] [address]   Respiratory Panel by PCR     Status: None   Collection Time: 07/29/18 12:14 PM   Specimen: Nasopharyngeal Swab; Respiratory  Result Value Ref Range Status   Adenovirus NOT DETECTED NOT DETECTED Final   Coronavirus 229E NOT DETECTED NOT DETECTED Final    Comment: (NOTE) The Coronavirus on the Respiratory Panel, DOES NOT test for the novel  Coronavirus (2019 nCoV)    Coronavirus HKU1 NOT DETECTED NOT DETECTED Final   Coronavirus NL63 NOT DETECTED NOT DETECTED Final   Coronavirus OC43 NOT DETECTED NOT DETECTED Final   Metapneumovirus NOT DETECTED NOT DETECTED Final   Rhinovirus / Enterovirus NOT DETECTED NOT DETECTED Final   Influenza A NOT DETECTED NOT DETECTED Final   Influenza B NOT DETECTED NOT DETECTED Final   Parainfluenza Virus 1 NOT DETECTED NOT DETECTED Final   Parainfluenza Virus 2 NOT DETECTED NOT DETECTED Final   Parainfluenza Virus 3 NOT DETECTED NOT DETECTED Final   Parainfluenza Virus 4 NOT DETECTED NOT DETECTED Final    Respiratory Syncytial Virus NOT DETECTED NOT DETECTED Final   Bordetella pertussis NOT DETECTED NOT DETECTED Final   Chlamydophila pneumoniae NOT DETECTED NOT DETECTED Final   Mycoplasma pneumoniae NOT DETECTED NOT DETECTED Final    Comment: Performed at [facility], [address] [place], [place] [address]    Radiology Reports Ct Abdomen Pelvis W Contrast  Result Date: 07/29/2018 CLINICAL DATA:  58 year old female with right upper quadrant abdominal pain. Strong clinical suspicion for COVID-19. EXAM: CT ABDOMEN AND PELVIS WITH CONTRAST TECHNIQUE: Multidetector CT imaging of the abdomen and pelvis was performed using the standard protocol following bolus administration of intravenous contrast. CONTRAST:  173m OMNIPAQUE IOHEXOL 300 MG/ML  SOLN COMPARISON:  None. FINDINGS: Lower chest: Bilateral patchy lower lobe consolidative changes as well as scattered bilateral confluent hazy densities noted. There are a spectrum of findings in the lungs which can be seen with acute atypical infection (as well as other non-infectious etiologies). In particular, viral pneumonia (including COVID-19) should be considered in the appropriate clinical setting. No intra-abdominal free air or free fluid. Hepatobiliary: Probable mild fatty liver. No intrahepatic biliary ductal dilatation. No calcified gallstone. Possible mild adenomyomatosis of the gallbladder fundus. Pancreas: Unremarkable. No pancreatic ductal dilatation or surrounding inflammatory changes. Spleen: Normal in size without focal abnormality. Adrenals/Urinary Tract: The adrenal glands, kidneys, and visualized ureters and urinary bladder appear unremarkable. Stomach/Bowel: There is no bowel obstruction or active inflammation. Normal appendix. Vascular/Lymphatic: No significant vascular findings are present. No enlarged abdominal or pelvic lymph nodes. Reproductive: The uterus is anteverted and grossly unremarkable. There is a 4 cm left ovarian/paraovarian  cyst. Ultrasound is recommended for better evaluation on a nonemergent basis. The right ovary is unremarkable. Other: Small fat containing umbilical hernia. Musculoskeletal: Degenerative changes at L5-S1 disc. No acute osseous pathology. IMPRESSION: 1. No  acute intra-abdominal or pelvic pathology. 2. Bilateral pulmonary densities likely combination of atelectasis and atypical infection. Please see discussion above. 3. A 4 cm left ovarian/paraovarian cyst. Electronically Signed   By: Anner Crete M.D.   On: 07/29/2018 02:00   Dg Chest Port 1 View  Result Date: 07/30/2018 CLINICAL DATA:  Shortness of breath.  COVID-19. EXAM: PORTABLE CHEST 1 VIEW COMPARISON:  CT 07/29/2018.  Chest x-ray 07/28/2018. FINDINGS: Mediastinum hilar structures normal. Heart size stable. Persistent dense bibasilar atelectasis/infiltrates. Tiny bilateral pleural effusions cannot be excluded. No pneumothorax. IMPRESSION: Persistent dense bibasilar atelectasis/infiltrates. Tiny bilateral pleural effusions cannot be excluded. Similar findings noted on prior CT. Electronically Signed   By: Marcello Moores  Register   On: 07/30/2018 06:54   Dg Chest Port 1 View  Result Date: 07/28/2018 CLINICAL DATA:  Cough, fever, nausea, vomiting, and worsening chest pain for 8 days, has been in close contact with a COVID-19 positive individual EXAM: PORTABLE CHEST 1 VIEW COMPARISON:  Portable exam 2218 hours without priors for comparison FINDINGS: Normal heart size, mediastinal contours and pulmonary vascularity. Hazy infiltrate RIGHT mid lung. Additional infiltrate versus atelectasis at LEFT base. Upper lungs clear. No pleural effusion or pneumothorax. Bones demineralized. IMPRESSION: Hazy infiltrate RIGHT mid lung with more prominent opacity at LEFT base which could represent infiltrate or atelectasis. Electronically Signed   By: Lavonia Dana M.D.   On: 07/28/2018 23:07

## 2018-08-03 NOTE — Progress Notes (Signed)
Nutrition Follow-up  DOCUMENTATION CODES:   Not applicable  INTERVENTION:   Continue Ensure Enlive po BID, each supplement provides 350 kcal and 20 grams of protein.  Continue Hormel Shake daily with breakfast which provides 520 kcals and 22 g of protein and Magic cup BID with lunch and dinner, each supplement provides 290 kcal and 9 grams of protein, automatically on meal trays to optimize nutritional intake.    NUTRITION DIAGNOSIS:   Increased nutrient needs related to acute illness(COVID) as evidenced by estimated needs.  Ongoing  GOAL:   Patient will meet greater than or equal to 90% of their needs  Progressing  MONITOR:   PO intake, Supplement acceptance, Labs  ASSESSMENT:   58 yo Spanish speaking female admitted with COVID-19. Six family members (4 adults and 2 children) living in the same house have tested positive for COVID-19. PMH of GERD.  PO intake seems to have improved, patient is consuming 50-100% of meals. She is also receiving Ensure Enlive BID, Hormel Shake once daily, and Magic cup BID. Noted possibility for d/c home soon.  Labs and medications reviewed.   Diet Order:   Diet Order            Diet regular Room service appropriate? Yes; Fluid consistency: Thin  Diet effective now              EDUCATION NEEDS:   No education needs have been identified at this time  Skin:  Skin Assessment: Reviewed RN Assessment  Last BM:  7/1  Height:   Ht Readings from Last 1 Encounters:  07/29/18 5' 3.78" (1.62 m)    Weight:   Wt Readings from Last 1 Encounters:  08/03/18 76.3 kg    Ideal Body Weight:  54.5 kg  BMI:  Body mass index is 29.06 kg/m.  Estimated Nutritional Needs:   Kcal:  1700-1900  Protein:  90-105 gm  Fluid:  >/= 1.7 L   Molli Barrows, RD, LDN, [place] Pager [phone] After Hours Pager [phone]

## 2018-08-04 LAB — COMPREHENSIVE METABOLIC PANEL
ALT: 174 U/L — ABNORMAL HIGH (ref 0–44)
AST: 40 U/L (ref 15–41)
Albumin: 3.2 g/dL — ABNORMAL LOW (ref 3.5–5.0)
Alkaline Phosphatase: 87 U/L (ref 38–126)
Anion gap: 10 (ref 5–15)
BUN: 29 mg/dL — ABNORMAL HIGH (ref 6–20)
CO2: 26 mmol/L (ref 22–32)
Calcium: 8.7 mg/dL — ABNORMAL LOW (ref 8.9–10.3)
Chloride: 100 mmol/L (ref 98–111)
Creatinine, Ser: 0.71 mg/dL (ref 0.44–1.00)
GFR calc Af Amer: >60 mL/min (ref >60)
GFR calc non Af Amer: >60 mL/min (ref >60)
Glucose, Bld: 134 mg/dL — ABNORMAL HIGH (ref 70–99)
Potassium: 4.1 mmol/L (ref 3.5–5.1)
Sodium: 136 mmol/L (ref 135–145)
Total Bilirubin: 0.6 mg/dL (ref 0.3–1.2)
Total Protein: 6.6 g/dL (ref 6.5–8.1)

## 2018-08-04 LAB — CBC WITH DIFFERENTIAL/PLATELET
Abs Immature Granulocytes: 1.67 10*3/uL — ABNORMAL HIGH (ref 0.00–0.07)
Basophils Absolute: 0.1 10*3/uL (ref 0.0–0.1)
Basophils Relative: 1 %
Eosinophils Absolute: 0 10*3/uL (ref 0.0–0.5)
Eosinophils Relative: 0 %
HCT: 40.6 % (ref 36.0–46.0)
Hemoglobin: 12.7 g/dL (ref 12.0–15.0)
Immature Granulocytes: 12 %
Lymphocytes Relative: 9 %
Lymphs Abs: 1.3 10*3/uL (ref 0.7–4.0)
MCH: 28.2 pg (ref 26.0–34.0)
MCHC: 31.3 g/dL (ref 30.0–36.0)
MCV: 90 fL (ref 80.0–100.0)
Monocytes Absolute: 1 10*3/uL (ref 0.1–1.0)
Monocytes Relative: 7 %
Neutro Abs: 9.9 10*3/uL — ABNORMAL HIGH (ref 1.7–7.7)
Neutrophils Relative %: 71 %
Platelets: 667 10*3/uL — ABNORMAL HIGH (ref 150–400)
RBC: 4.51 MIL/uL (ref 3.87–5.11)
RDW: 14 % (ref 11.5–15.5)
WBC: 13.9 10*3/uL — ABNORMAL HIGH (ref 4.0–10.5)
nRBC: 1.5 % — ABNORMAL HIGH (ref 0.0–0.2)

## 2018-08-04 LAB — GLUCOSE, CAPILLARY
Glucose-Capillary: 112 mg/dL — ABNORMAL HIGH (ref 70–99)
Glucose-Capillary: 201 mg/dL — ABNORMAL HIGH (ref 70–99)

## 2018-08-04 LAB — C-REACTIVE PROTEIN: CRP: 0.9 mg/dL (ref <1.0)

## 2018-08-04 LAB — FERRITIN: Ferritin: 511 ng/mL — ABNORMAL HIGH (ref 11–307)

## 2018-08-04 LAB — BRAIN NATRIURETIC PEPTIDE: B Natriuretic Peptide: 26.9 pg/mL (ref 0.0–100.0)

## 2018-08-04 LAB — LACTATE DEHYDROGENASE: LDH: 251 U/L — ABNORMAL HIGH (ref 98–192)

## 2018-08-04 LAB — D-DIMER, QUANTITATIVE: D-Dimer, Quant: 0.49 ug/mL-FEU (ref 0.00–0.50)

## 2018-08-04 LAB — MAGNESIUM: Magnesium: 2.5 mg/dL — ABNORMAL HIGH (ref 1.7–2.4)

## 2018-08-04 MED ORDER — BENZONATATE 100 MG PO CAPS: 200.0000 mg | ORAL_CAPSULE | Freq: Three times a day (TID) | ORAL | 0 refills | Status: AC | PRN

## 2018-08-04 MED ORDER — ALBUTEROL SULFATE HFA 108 (90 BASE) MCG/ACT IN AERS: 2.0000 | INHALATION_SPRAY | Freq: Four times a day (QID) | RESPIRATORY_TRACT | 0 refills | Status: AC | PRN

## 2018-08-04 MED ORDER — ENSURE ENLIVE PO LIQD: 237.0000 mL | Freq: Two times a day (BID) | ORAL | 0 refills | Status: AC

## 2018-08-04 MED ORDER — DEXAMETHASONE 0.5 MG PO TABS: 0.5000 mg | ORAL_TABLET | Freq: Four times a day (QID) | ORAL | 0 refills | Status: AC

## 2018-08-04 MED ORDER — ALBUTEROL SULFATE HFA 108 (90 BASE) MCG/ACT IN AERS
2.0000 | INHALATION_SPRAY | Freq: Three times a day (TID) | RESPIRATORY_TRACT | Status: DC
  Administered 2018-08-04: 2 via RESPIRATORY_TRACT

## 2018-08-04 NOTE — Progress Notes (Signed)
The pt's Daughter, Kathlee Nations, was called and provided with a daily update regarding the pt's current status. Any questions or concerns were addressed.

## 2018-08-04 NOTE — Discharge Summary (Signed)
PATIENT DETAILS Name: Tanya Sims Age: 58 y.o. Sex: female Date of Birth: July 07, 1960 MRN: [id number]. Admitting Physician: Hughie Closs, MD [id number], No Pcp Per  Admit Date: 07/28/2018 Discharge date: 08/04/2018  Recommendations for Outpatient Follow-up:  1. Follow up with PCP in 1-2 weeks 2. Please obtain BMP/CBC and LFTs in one week 3. Also asked if she still requires home O2 at next visit with PCP   Admitted From:  Home  Disposition: Home    Home Health: [facility]  Equipment/Devices: Oxygen 2-3L  Discharge Condition: Stable  CODE STATUS: FULL CODE  Diet recommendation:  Regular  Brief Summary: See H&P, Labs, Consult and Test reports for all details in brief, patient is a 58 year old Hispanic female with past medical history of GERD who presented with cough, shortness of breath, fever-found to have acute hypoxic respiratory failure secondary to COVID-19 pneumonia.  Brief Hospital Course: Acute hypoxic respiratory failure secondary to COVID 19 pneumonia: Required as much as 8 L of oxygen at one point-treated with intravenous steroids, Remdesivir (dose stopped due to increasing LFTs) and Actemra.  Clinically improved-hypoxia much better-stable on 2 L of oxygen overnight.  She is anxious to go home-Per nursing staff-has ambulated without any issues-since has been relatively stable on just 2 L of oxygen-suspect can go home on steroids and as needed albuterol inhaler.  PCP at next visit to reassess whether oxygen is still required.  Transaminitis: Likely secondary to COVID-19-probably worsened by the use of Remdesivir and Actemra.  Remdesivir was stopped after 3 doses-ALT is now trending down.  Please recheck LFTs at next visit with PCP.  LFTs are continuing to downtrend.  GERD: Continue PPI  Note-this MD attempted to call patient's daughter via [employer] translator-got voicemail-translator left a brief message.   Procedures/Studies: None  Discharge Diagnoses:    Active Problems:   Suspected Covid-19 Virus Infection   Acute respiratory failure with hypoxia (HCC)   Close Exposure to Covid-19 Virus   GERD (gastroesophageal reflux disease)   Right upper quadrant pain   Sepsis (HCC)   Viral pneumonia   COVID-19 virus infection   Discharge Instructions:  Activity:  As tolerated  Discharge Instructions    Call MD for:  difficulty breathing, headache or visual disturbances   Complete by: As directed    Call MD for:  persistant dizziness or light-headedness   Complete by: As directed    Call MD for:  temperature >100.4   Complete by: As directed    Diet general   Complete by: As directed    Discharge instructions   Complete by: As directed    Follow with Primary MD in 1 week  Please get a complete blood count and chemistry panel checked by your Primary MD at your next visit, and again as instructed by your Primary MD.  Get Medicines reviewed and adjusted: Please take all your medications with you for your next visit with your Primary MD  Laboratory/radiological data: Please request your Primary MD to go over all hospital tests and procedure/radiological results at the follow up, please ask your Primary MD to get all Hospital records sent to his/her office.  In some cases, they will be blood work, cultures and biopsy results pending at the time of your discharge. Please request that your primary care M.D. follows up on these results.  Also Note the following: If you experience worsening of your admission symptoms, develop shortness of breath, life threatening emergency, suicidal or homicidal thoughts you must seek medical attention immediately by calling  911 or calling your MD immediately  if symptoms less severe.  You must read complete instructions/literature along with all the possible adverse reactions/side effects for all the Medicines you take and that have been prescribed to you. Take any new Medicines after you have completely  understood and accpet all the possible adverse reactions/side effects.   Do not drive when taking Pain medications or sleeping medications (Benzodaizepines)  Do not take more than prescribed Pain, Sleep and Anxiety Medications. It is not advisable to combine anxiety,sleep and pain medications without talking with your primary care practitioner  Special Instructions: If you have smoked or chewed Tobacco  in the last 2 yrs please stop smoking, stop any regular Alcohol  and or any Recreational drug use.  Wear Seat belts while driving.  Please note: You were cared for by a hospitalist during your hospital stay. Once you are discharged, your primary care physician will handle any further medical issues. Please note that NO REFILLS for any discharge medications will be authorized once you are discharged, as it is imperative that you return to your primary care physician (or establish a relationship with a primary care physician if you do not have one) for your post hospital discharge needs so that they can reassess your need for medications and monitor your lab values.  ?   Person Under Monitoring Name: Tanya Sims  Location: [address] [place] [address]   Infection Prevention Recommendations for Individuals Confirmed to have, or Being Evaluated for, 2019 Novel Coronavirus (COVID-19) Infection Who Receive Care at Home  Individuals who are confirmed to have, or are being evaluated for, COVID-19 should follow the prevention steps below until a healthcare provider or local or state health department says they can return to normal activities.  Stay home except to get medical care You should restrict activities outside your home, except for getting medical care. Do not go to work, school, or public areas, and do not use public transportation or taxis.  Call ahead before visiting your doctor Before your medical appointment, call the healthcare provider and tell them that you  have, or are being evaluated for, COVID-19 infection. This will help the healthcare providers office take steps to keep other people from getting infected. Ask your healthcare provider to call the local or state health department.  Monitor your symptoms Seek prompt medical attention if your illness is worsening (e.g., difficulty breathing). Before going to your medical appointment, call the healthcare provider and tell them that you have, or are being evaluated for, COVID-19 infection. Ask your healthcare provider to call the local or state health department.  Wear a facemask You should wear a facemask that covers your nose and mouth when you are in the same room with other people and when you visit a healthcare provider. People who live with or visit you should also wear a facemask while they are in the same room with you.  Separate yourself from other people in your home As much as possible, you should stay in a different room from other people in your home. Also, you should use a separate bathroom, if available.  Avoid sharing household items You should not share dishes, drinking glasses, cups, eating utensils, towels, bedding, or other items with other people in your home. After using these items, you should wash them thoroughly with soap and water.  Cover your coughs and sneezes Cover your mouth and nose with a tissue when you cough or sneeze, or you can cough or  sneeze into your sleeve. Throw used tissues in a lined trash can, and immediately wash your hands with soap and water for at least 20 seconds or use an alcohol-based hand rub.  Wash your [employer] your hands often and thoroughly with soap and water for at least 20 seconds. You can use an alcohol-based hand sanitizer if soap and water are not available and if your hands are not visibly dirty. Avoid touching your eyes, nose, and mouth with unwashed hands.   Prevention Steps for Caregivers and Household Members  of Individuals Confirmed to have, or Being Evaluated for, COVID-19 Infection Being Cared for in the Home  If you live with, or provide care at home for, a person confirmed to have, or being evaluated for, COVID-19 infection please follow these guidelines to prevent infection:  Follow healthcare providers instructions Make sure that you understand and can help the patient follow any healthcare provider instructions for all care.  Provide for the patients basic needs You should help the patient with basic needs in the home and provide support for getting groceries, prescriptions, and other personal needs.  Monitor the patients symptoms If they are getting sicker, call his or her medical provider and tell them that the patient has, or is being evaluated for, COVID-19 infection. This will help the healthcare providers office take steps to keep other people from getting infected. Ask the healthcare provider to call the local or state health department.  Limit the number of people who have contact with the patient If possible, have only one caregiver for the patient. Other household members should stay in another home or place of residence. If this is not possible, they should stay in another room, or be separated from the patient as much as possible. Use a separate bathroom, if available. Restrict visitors who do not have an essential need to be in the home.  Keep older adults, very young children, and other sick people away from the patient Keep older adults, very young children, and those who have compromised immune systems or chronic health conditions away from the patient. This includes people with chronic heart, lung, or kidney conditions, diabetes, and cancer.  Ensure good ventilation Make sure that shared spaces in the home have good air flow, such as from an air conditioner or an opened window, weather permitting.  Wash your hands often Wash your hands often and thoroughly with  soap and water for at least 20 seconds. You can use an alcohol based hand sanitizer if soap and water are not available and if your hands are not visibly dirty. Avoid touching your eyes, nose, and mouth with unwashed hands. Use disposable paper towels to dry your hands. If not available, use dedicated cloth towels and replace them when they become wet.  Wear a facemask and gloves Wear a disposable facemask at all times in the room and gloves when you touch or have contact with the patients blood, body fluids, and/or secretions or excretions, such as sweat, saliva, sputum, nasal mucus, vomit, urine, or feces.  Ensure the mask fits over your nose and mouth tightly, and do not touch it during use. Throw out disposable facemasks and gloves after using them. Do not reuse. Wash your hands immediately after removing your facemask and gloves. If your personal clothing becomes contaminated, carefully remove clothing and launder. Wash your hands after handling contaminated clothing. Place all used disposable facemasks, gloves, and other waste in a lined container before disposing them with other household waste. Remove  gloves and wash your hands immediately after handling these items.  Do not share dishes, glasses, or other household items with the patient Avoid sharing household items. You should not share dishes, drinking glasses, cups, eating utensils, towels, bedding, or other items with a patient who is confirmed to have, or being evaluated for, COVID-19 infection. After the person uses these items, you should wash them thoroughly with soap and water.  Wash laundry thoroughly Immediately remove and wash clothes or bedding that have blood, body fluids, and/or secretions or excretions, such as sweat, saliva, sputum, nasal mucus, vomit, urine, or feces, on them. Wear gloves when handling laundry from the patient. Read and follow directions on labels of laundry or clothing items and detergent. In general,  wash and dry with the warmest temperatures recommended on the label.  Clean all areas the individual has used often Clean all touchable surfaces, such as counters, tabletops, doorknobs, bathroom fixtures, toilets, phones, keyboards, tablets, and bedside tables, every day. Also, clean any surfaces that may have blood, body fluids, and/or secretions or excretions on them. Wear gloves when cleaning surfaces the patient has come in contact with. Use a diluted bleach solution (e.g., dilute bleach with 1 part bleach and 10 parts water) or a household disinfectant with a label that says EPA-registered for coronaviruses. To make a bleach solution at home, add 1 tablespoon of bleach to 1 quart (4 cups) of water. For a larger supply, add  cup of bleach to 1 gallon (16 cups) of water. Read labels of cleaning products and follow recommendations provided on product labels. Labels contain instructions for safe and effective use of the cleaning product including precautions you should take when applying the product, such as wearing gloves or eye protection and making sure you have good ventilation during use of the product. Remove gloves and wash hands immediately after cleaning.  Monitor yourself for signs and symptoms of illness Caregivers and household members are considered close contacts, should monitor their health, and will be asked to limit movement outside of the home to the extent possible. Follow the monitoring steps for close contacts listed on the symptom monitoring form.   ? If you have additional questions, contact your local health department or call the epidemiologist on call at [phone] (available 24/7). ? This guidance is subject to change. For the most up-to-date guidance from [facility], please refer to their website: TripMetro.hu   Increase activity slowly   Complete by: As directed      Allergies as of 08/04/2018   No Known  Allergies     Medication List    TAKE these medications   acetaminophen 500 MG tablet Commonly known as: TYLENOL Take 1,000 mg by mouth every 6 (six) hours as needed for mild pain or fever.   albuterol 108 (90 Base) MCG/ACT inhaler Commonly known as: VENTOLIN HFA Inhale 2 puffs into the lungs every 6 (six) hours as needed for wheezing or shortness of breath.   benzonatate 100 MG capsule Commonly known as: Tessalon Perles Take 2 capsules (200 mg total) by mouth 3 (three) times daily as needed for cough.   dexamethasone 0.5 MG tablet Commonly known as: Decadron Take 1 tablet (0.5 mg total) by mouth every 6 (six) hours for 5 days.   feeding supplement (ENSURE ENLIVE) Liqd Take 237 mLs by mouth 2 (two) times daily between meals.   omeprazole 40 MG capsule Commonly known as: PRILOSEC Take 1 capsule (40 mg total) by mouth daily for 30 days.  Durable Medical Equipment  (From admission, onward)         Start     Ordered   08/01/18 1702  For home use only DME Bedside commode  Once    Question:  Patient needs a bedside commode to treat with the following condition  Answer:  Physical deconditioning   08/01/18 1701   08/01/18 0800  For home use only DME oxygen  Once    Question Answer Comment  Length of Need 6 Months   Mode or (Route) Nasal cannula   Liters per Minute 3   Frequency Continuous (stationary and portable oxygen unit needed)   Oxygen conserving device Yes   Oxygen delivery system Gas      08/01/18 0759         Follow-up Information    [facility] Follow up on 08/14/2018.   Why: at 9:30am for your hospital follow-up telephone appointment; Please be available for the call.  Contact information: [address] [place] [address] [phone]       [facility] Follow up.   Why: home oxygen Contact information: [address] [place] [address] [phone]           No Known Allergies   Consultations:   None   Other Procedures/Studies: Ct Abdomen Pelvis W Contrast  Result Date: 07/29/2018 CLINICAL DATA:  58 year old female with right upper quadrant abdominal pain. Strong clinical suspicion for COVID-19. EXAM: CT ABDOMEN AND PELVIS WITH CONTRAST TECHNIQUE: Multidetector CT imaging of the abdomen and pelvis was performed using the standard protocol following bolus administration of intravenous contrast. CONTRAST:  OMNIPAQUE IOHEXOL 300 MG/ML  SOLN COMPARISON:  None. FINDINGS: Lower chest: Bilateral patchy lower lobe consolidative changes as well as scattered bilateral confluent hazy densities noted. There are a spectrum of findings in the lungs which can be seen with acute atypical infection (as well as other non-infectious etiologies). In particular, viral pneumonia (including COVID-19) should be considered in the appropriate clinical setting. No intra-abdominal free air or free fluid. Hepatobiliary: Probable mild fatty liver. No intrahepatic biliary ductal dilatation. No calcified gallstone. Possible mild adenomyomatosis of the gallbladder fundus. Pancreas: Unremarkable. No pancreatic ductal dilatation or surrounding inflammatory changes. Spleen: Normal in size without focal abnormality. Adrenals/Urinary Tract: The adrenal glands, kidneys, and visualized ureters and urinary bladder appear unremarkable. Stomach/Bowel: There is no bowel obstruction or active inflammation. Normal appendix. Vascular/Lymphatic: No significant vascular findings are present. No enlarged abdominal or pelvic lymph nodes. Reproductive: The uterus is anteverted and grossly unremarkable. There is a 4 cm left ovarian/paraovarian cyst. Ultrasound is recommended for better evaluation on a nonemergent basis. The right ovary is unremarkable. Other: Small fat containing umbilical hernia. Musculoskeletal: Degenerative changes at L5-S1 disc. No acute osseous pathology. IMPRESSION: 1. No acute  intra-abdominal or pelvic pathology. 2. Bilateral pulmonary densities likely combination of atelectasis and atypical infection. Please see discussion above. 3. A 4 cm left ovarian/paraovarian cyst. Electronically Signed   By: Elgie Collard M.D.   On: 07/29/2018 02:00   Dg Chest Port 1 View  Result Date: 07/30/2018 CLINICAL DATA:  Shortness of breath.  COVID-19. EXAM: PORTABLE CHEST 1 VIEW COMPARISON:  CT 07/29/2018.  Chest x-ray 07/28/2018. FINDINGS: Mediastinum hilar structures normal. Heart size stable. Persistent dense bibasilar atelectasis/infiltrates. Tiny bilateral pleural effusions cannot be excluded. No pneumothorax. IMPRESSION: Persistent dense bibasilar atelectasis/infiltrates. Tiny bilateral pleural effusions cannot be excluded. Similar findings noted on prior CT. Electronically Signed   By: Maisie Fus  Register   On: 07/30/2018 06:54   Dg Chest Port 1 View  Result Date: 07/28/2018 CLINICAL DATA:  Cough, fever, nausea, vomiting, and worsening chest pain for 8 days, has been in close contact with a COVID-19 positive individual EXAM: PORTABLE CHEST 1 VIEW COMPARISON:  Portable exam 2218 hours without priors for comparison FINDINGS: Normal heart size, mediastinal contours and pulmonary vascularity. Hazy infiltrate RIGHT mid lung. Additional infiltrate versus atelectasis at LEFT base. Upper lungs clear. No pleural effusion or pneumothorax. Bones demineralized. IMPRESSION: Hazy infiltrate RIGHT mid lung with more prominent opacity at LEFT base which could represent infiltrate or atelectasis. Electronically Signed   By: Lavonia Dana M.D.   On: 07/28/2018 23:07      TODAY-DAY OF DISCHARGE:  Subjective:   Tanya Sims today has no headache,no chest abdominal pain,no new weakness tingling or numbness, feels much better wants to go home today.   Objective:   Blood pressure 104/63, pulse 79, temperature 98.2 F (36.8 C), temperature source Oral, resp. rate 18, height 5' 3.78" (1.62 m), weight  76.3 kg, SpO2 92 %.  Intake/Output Summary (Last 24 hours) at 08/04/2018 1103 Last data filed at 08/04/2018 0800 Gross per 24 hour  Intake 960 ml  Output 3100 ml  Net -2140 ml   Filed Weights   07/31/18 0500 08/01/18 0500 08/03/18 0437  Weight: 75.4 kg 75 kg 76.3 kg    Exam: Awake Alert, Oriented *3, No new F.N deficits, Normal affect [place].AT,PERRAL Supple Neck,No JVD, No cervical lymphadenopathy appriciated.  Symmetrical Chest wall movement, Good air movement bilaterally, CTAB RRR,No Gallops,Rubs or new Murmurs, No Parasternal Heave +ve B.Sounds, Abd Soft, Non tender, No organomegaly appriciated, No rebound -guarding or rigidity. No Cyanosis, Clubbing or edema, No new Rash or bruise   PERTINENT RADIOLOGIC STUDIES: Ct Abdomen Pelvis W Contrast  Result Date: 07/29/2018 CLINICAL DATA:  58 year old female with right upper quadrant abdominal pain. Strong clinical suspicion for COVID-19. EXAM: CT ABDOMEN AND PELVIS WITH CONTRAST TECHNIQUE: Multidetector CT imaging of the abdomen and pelvis was performed using the standard protocol following bolus administration of intravenous contrast. CONTRAST:  175mL OMNIPAQUE IOHEXOL 300 MG/ML  SOLN COMPARISON:  None. FINDINGS: Lower chest: Bilateral patchy lower lobe consolidative changes as well as scattered bilateral confluent hazy densities noted. There are a spectrum of findings in the lungs which can be seen with acute atypical infection (as well as other non-infectious etiologies). In particular, viral pneumonia (including COVID-19) should be considered in the appropriate clinical setting. No intra-abdominal free air or free fluid. Hepatobiliary: Probable mild fatty liver. No intrahepatic biliary ductal dilatation. No calcified gallstone. Possible mild adenomyomatosis of the gallbladder fundus. Pancreas: Unremarkable. No pancreatic ductal dilatation or surrounding inflammatory changes. Spleen: Normal in size without focal abnormality. Adrenals/Urinary  Tract: The adrenal glands, kidneys, and visualized ureters and urinary bladder appear unremarkable. Stomach/Bowel: There is no bowel obstruction or active inflammation. Normal appendix. Vascular/Lymphatic: No significant vascular findings are present. No enlarged abdominal or pelvic lymph nodes. Reproductive: The uterus is anteverted and grossly unremarkable. There is a 4 cm left ovarian/paraovarian cyst. Ultrasound is recommended for better evaluation on a nonemergent basis. The right ovary is unremarkable. Other: Small fat containing umbilical hernia. Musculoskeletal: Degenerative changes at L5-S1 disc. No acute osseous pathology. IMPRESSION: 1. No acute intra-abdominal or pelvic pathology. 2. Bilateral pulmonary densities likely combination of atelectasis and atypical infection. Please see discussion above. 3. A 4 cm left ovarian/paraovarian cyst. Electronically Signed   By: Anner Crete M.D.   On:  07/29/2018 02:00   Dg Chest Port 1 View  Result Date: 07/30/2018 CLINICAL DATA:  Shortness of breath.  COVID-19. EXAM: PORTABLE CHEST 1 VIEW COMPARISON:  CT 07/29/2018.  Chest x-ray 07/28/2018. FINDINGS: Mediastinum hilar structures normal. Heart size stable. Persistent dense bibasilar atelectasis/infiltrates. Tiny bilateral pleural effusions cannot be excluded. No pneumothorax. IMPRESSION: Persistent dense bibasilar atelectasis/infiltrates. Tiny bilateral pleural effusions cannot be excluded. Similar findings noted on prior CT. Electronically Signed   By: Maisie Fushomas  Register   On: 07/30/2018 06:54   Dg Chest Port 1 View  Result Date: 07/28/2018 CLINICAL DATA:  Cough, fever, nausea, vomiting, and worsening chest pain for 8 days, has been in close contact with a COVID-19 positive individual EXAM: PORTABLE CHEST 1 VIEW COMPARISON:  Portable exam 2218 hours without priors for comparison FINDINGS: Normal heart size, mediastinal contours and pulmonary vascularity. Hazy infiltrate RIGHT mid lung. Additional  infiltrate versus atelectasis at LEFT base. Upper lungs clear. No pleural effusion or pneumothorax. Bones demineralized. IMPRESSION: Hazy infiltrate RIGHT mid lung with more prominent opacity at LEFT base which could represent infiltrate or atelectasis. Electronically Signed   By: Ulyses SouthwardMark  Boles M.D.   On: 07/28/2018 23:07     PERTINENT LAB RESULTS: CBC: Recent Labs    08/03/18 0300 08/04/18 0422  WBC 14.8* 13.9*  HGB 12.5 12.7  HCT 39.7 40.6  PLT 622* 667*   CMET CMP     Component Value Date/Time   NA 136 08/04/2018 0422   K 4.1 08/04/2018 0422   CL 100 08/04/2018 0422   CO2 26 08/04/2018 0422   GLUCOSE 134 (H) 08/04/2018 0422   BUN 29 (H) 08/04/2018 0422   CREATININE 0.71 08/04/2018 0422   CALCIUM 8.7 (L) 08/04/2018 0422   PROT 6.6 08/04/2018 0422   ALBUMIN 3.2 (L) 08/04/2018 0422   AST 40 08/04/2018 0422   ALT 174 (H) 08/04/2018 0422   ALKPHOS 87 08/04/2018 0422   BILITOT 0.6 08/04/2018 0422   GFRNONAA >60 08/04/2018 0422   GFRAA >60 08/04/2018 0422    GFR Estimated Creatinine Clearance: 77.2 mL/min (by C-G formula based on SCr of 0.71 mg/dL). No results for input(s): LIPASE, AMYLASE in the last 72 hours. No results for input(s): CKTOTAL, CKMB, CKMBINDEX, TROPONINI in the last 72 hours. Invalid input(s): POCBNP Recent Labs    08/03/18 0300 08/04/18 0422  DDIMER 0.71* 0.49   Recent Labs    08/02/18 0300  HGBA1C 6.2*   No results for input(s): CHOL, HDL, LDLCALC, TRIG, CHOLHDL, LDLDIRECT in the last 72 hours. No results for input(s): TSH, T4TOTAL, T3FREE, THYROIDAB in the last 72 hours.  Invalid input(s): FREET3 Recent Labs    08/03/18 0300 08/04/18 0422  FERRITIN 541* 511*   Coags: Recent Labs    08/01/18 1503  INR 1.0   Microbiology: Recent Results (from the past 240 hour(s))  Culture, blood (routine x 2)     Status: None   Collection Time: 07/28/18 10:00 PM   Specimen: BLOOD LEFT ARM  Result Value Ref Range Status   Specimen Description    Final    BLOOD LEFT ARM Performed at [facility] [place], [address] [place], [place] [address]    Special Requests   Final    BOTTLES DRAWN AEROBIC AND ANAEROBIC Blood Culture adequate volume Performed at [facility] [place], [address] [place], [place] [address]    Culture   Final    NO GROWTH 5 DAYS Performed at [facility], [address] [place], [place] [address]    Report Status 08/03/2018 FINAL  Final  Culture, blood (routine x 2)     Status: None   Collection Time: 07/28/18 10:10 PM   Specimen: BLOOD RIGHT ARM  Result Value Ref Range Status   Specimen Description   Final    BLOOD RIGHT ARM Performed at [facility] [place], [address] [place], [place] [address]    Special Requests   Final    BOTTLES DRAWN AEROBIC AND ANAEROBIC Blood Culture adequate volume   Culture   Final    NO GROWTH 5 DAYS Performed at [facility], [address] [place], [place] [address]    Report Status 08/03/2018 FINAL  Final  SARS Coronavirus 2 (Hosp order,Performed in [facility] lab via Abbott ID)     Status: None   Collection Time: 07/28/18 11:00 PM   Specimen: Dry Nasal Swab (Abbott ID Now)  Result Value Ref Range Status   SARS Coronavirus 2 (Abbott ID Now) NEGATIVE NEGATIVE Final    Comment: (NOTE) Interpretive Result Comment(s): COVID 19 Positive SARS CoV 2 target nucleic acids are DETECTED. The SARS CoV 2 RNA is generally detectable in upper and lower respiratory specimens during the acute phase of infection.  Positive results are indicative of active infection with SARS CoV 2.  Clinical correlation with patient history and other diagnostic information is necessary to determine patient infection status.  Positive results do not rule out bacterial infection or coinfection with other viruses. The expected result is Negative. COVID 19 Negative SARS CoV 2 target nucleic acids are NOT DETECTED. The SARS CoV 2 RNA is generally detectable in  upper and lower respiratory specimens during the acute phase of infection.  Negative results do not preclude SARS CoV 2 infection, do not rule out coinfections with other pathogens, and should not be used as the sole basis for treatment or other patient management decisions.  Negative results must be combined with clinical  observations, patient history, and epidemiological information. The expected result is Negative. Invalid Presence or absence of SARS CoV 2 nucleic acids cannot be determined. Repeat testing was performed on the submitted specimen and repeated Invalid results were obtained.  If clinically indicated, additional testing on a new specimen with an alternate test methodology [phone]([id number]) is advised.  The SARS CoV 2 RNA is generally detectable in upper and lower respiratory specimens during the acute phase of infection. The expected result is Negative. Fact Sheet for Patients:  http://www.graves-ford.org/https://www.fda.gov/media/136524/download Fact Sheet for Healthcare Providers: EnviroConcern.sihttps://www.fda.gov/media/136523/download This test is not yet approved or cleared by the [place] FDA and has been authorized for detection and/or diagnosis of SARS CoV 2 by FDA under an Emergency Use Authorization (EUA).  This EUA will remain in effect (meaning this test can be used) for the duration of the COVID19 d eclaration under Section 564(b)(1) of the Act, 21 U.S.C. section [phone] 3(b)(1), unless the authorization is terminated or revoked sooner. Performed at [facility] [place], [address] [place], [place] [address]   SARS Coronavirus 2 (CEPHEID - Performed in [facility] hospital lab), Hosp Order     Status: Abnormal   Collection Time: 07/29/18  9:29 AM   Specimen: Nasal Mucosa; Nasopharyngeal  Result Value Ref Range Status   SARS Coronavirus 2 POSITIVE (A) NEGATIVE Final    Comment: RESULT CALLED TO, READ BACK BY AND VERIFIED WITH: S.Anne NgFRANKLIN,RN [phone] BY V.WILKINS (NOTE) If result is  NEGATIVE SARS-CoV-2 target nucleic  acids are NOT DETECTED. The SARS-CoV-2 RNA is generally detectable in upper and lower  respiratory specimens during the acute phase of infection. The lowest  concentration of SARS-CoV-2 viral copies this assay can detect is 250  copies / mL. A negative result does not preclude SARS-CoV-2 infection  and should not be used as the sole basis for treatment or other  patient management decisions.  A negative result may occur with  improper specimen collection / handling, submission of specimen other  than nasopharyngeal swab, presence of viral mutation(s) within the  areas targeted by this assay, and inadequate number of viral copies  (<250 copies / mL). A negative result must be combined with clinical  observations, patient history, and epidemiological information. If result is POSITIVE SARS-CoV-2 target nucleic acids are DETECTED. The  SARS-CoV-2 RNA is generally detectable in upper and lower  respiratory specimens during the acute phase of infection.  Positive  results are indicative of active infection with SARS-CoV-2.  Clinical  correlation with patient history and other diagnostic information is  necessary to determine patient infection status.  Positive results do  not rule out bacterial infection or co-infection with other viruses. If result is PRESUMPTIVE POSTIVE SARS-CoV-2 nucleic acids MAY BE PRESENT.   A presumptive positive result was obtained on the submitted specimen  and confirmed on repeat testing.  While 2019 novel coronavirus  (SARS-CoV-2) nucleic acids may be present in the submitted sample  additional confirmatory testing may be necessary for epidemiological  and / or clinical management purposes  to differentiate between  SARS-CoV-2 and other Sarbecovirus currently known to infect humans.  If clinically indicated additional testing with an alternate test  methodology [phone]([id number]) is a dvised. The SARS-CoV-2 RNA is generally  detectable  in upper and lower respiratory specimens during the acute  phase of infection. The expected result is Negative. Fact Sheet for Patients:  BoilerBrush.com.cyhttps://www.fda.gov/media/136312/download Fact Sheet for Healthcare Providers: https://pope.com/https://www.fda.gov/media/136313/download This test is not yet approved or cleared by the [place] FDA and has been authorized for detection and/or diagnosis of SARS-CoV-2 by FDA under an Emergency Use Authorization (EUA).  This EUA will remain in effect (meaning this test can be used) for the duration of the COVID-19 declaration under Section 564(b)(1) of the Act, 21 U.S.C. section 360bbb-3(b)(1), unless the authorization is terminated or revoked sooner. Performed at [facility], [address] [place], [place] [address]   Novel Coronavirus, NAA (hospital order; send-out to ref lab)     Status: Abnormal   Collection Time: 07/29/18  9:29 AM   Specimen: Nasopharyngeal Swab; Respiratory  Result Value Ref Range Status   SARS-CoV-2, NAA DETECTED (A) NOT DETECTED Final    Comment: (NOTE) This test was developed and its performance characteristics determined by [facility]. This test has not been FDA cleared or approved. This test has been authorized by FDA under an Emergency Use Authorization (EUA). This test is only authorized for the duration of time the declaration that circumstances exist justifying the authorization of the emergency use of in vitro diagnostic tests for detection of SARS-CoV-2 virus and/or diagnosis of COVID-19 infection under section 564(b)(1) of the Act, 21 U.S.C. 782NFA-2(Z)(3360bbb-3(b)(1), unless the authorization is terminated or revoked sooner. When diagnostic testing is negative, the possibility of a false negative result should be considered in the context of a patient's recent exposures and the presence of clinical signs and symptoms consistent with COVID-19. An individual without symptoms of COVID-19 and who is not  shedding SARS-CoV-2 virus would  expect to have a negative (not detected) result in this assay. Performed  At: [facility] [address] [place], [place] [address] Jolene Schimke MD FA:[id number]    Coronavirus Source NASOPHARYNGEAL  Final    Comment: Performed at [facility], [address] [place], [place] [address]  MRSA PCR Screening     Status: None   Collection Time: 07/29/18  9:29 AM   Specimen: Nasal Mucosa; Nasopharyngeal  Result Value Ref Range Status   MRSA by PCR NEGATIVE NEGATIVE Final    Comment:        The GeneXpert MRSA Assay (FDA approved for NASAL specimens only), is one component of a comprehensive MRSA colonization surveillance program. It is not intended to diagnose MRSA infection nor to guide or monitor treatment for MRSA infections. Performed at [facility], [address] [place], [place] [address]   Respiratory Panel by PCR     Status: None   Collection Time: 07/29/18 12:14 PM   Specimen: Nasopharyngeal Swab; Respiratory  Result Value Ref Range Status   Adenovirus NOT DETECTED NOT DETECTED Final   Coronavirus 229E NOT DETECTED NOT DETECTED Final    Comment: (NOTE) The Coronavirus on the Respiratory Panel, DOES NOT test for the novel  Coronavirus (2019 nCoV)    Coronavirus HKU1 NOT DETECTED NOT DETECTED Final   Coronavirus NL63 NOT DETECTED NOT DETECTED Final   Coronavirus OC43 NOT DETECTED NOT DETECTED Final   Metapneumovirus NOT DETECTED NOT DETECTED Final   Rhinovirus / Enterovirus NOT DETECTED NOT DETECTED Final   Influenza A NOT DETECTED NOT DETECTED Final   Influenza B NOT DETECTED NOT DETECTED Final   Parainfluenza Virus 1 NOT DETECTED NOT DETECTED Final   Parainfluenza Virus 2 NOT DETECTED NOT DETECTED Final   Parainfluenza Virus 3 NOT DETECTED NOT DETECTED Final   Parainfluenza Virus 4 NOT DETECTED NOT DETECTED Final   Respiratory Syncytial Virus NOT DETECTED NOT DETECTED Final    Bordetella pertussis NOT DETECTED NOT DETECTED Final   Chlamydophila pneumoniae NOT DETECTED NOT DETECTED Final   Mycoplasma pneumoniae NOT DETECTED NOT DETECTED Final    Comment: Performed at [facility], [address], [place], [place] [address]    FURTHER DISCHARGE INSTRUCTIONS:  Get Medicines reviewed and adjusted: Please take all your medications with you for your next visit with your Primary MD  Laboratory/radiological data: Please request your Primary MD to go over all hospital tests and procedure/radiological results at the follow up, please ask your Primary MD to get all Hospital records sent to his/her office.  In some cases, they will be blood work, cultures and biopsy results pending at the time of your discharge. Please request that your primary care M.D. goes through all the records of your hospital data and follows up on these results.  Also Note the following: If you experience worsening of your admission symptoms, develop shortness of breath, life threatening emergency, suicidal or homicidal thoughts you must seek medical attention immediately by calling 911 or calling your MD immediately  if symptoms less severe.  You must read complete instructions/literature along with all the possible adverse reactions/side effects for all the Medicines you take and that have been prescribed to you. Take any new Medicines after you have completely understood and accpet all the possible adverse reactions/side effects.   Do not drive when taking Pain medications or sleeping medications (Benzodaizepines)  Do not take more than prescribed Pain, Sleep and Anxiety Medications. It is not advisable to  combine anxiety,sleep and pain medications without talking with your primary care practitioner  Special Instructions: If you have smoked or chewed Tobacco  in the last 2 yrs please stop smoking, stop any regular Alcohol  and or any Recreational drug use.  Wear Seat belts while  driving.  Please note: You were cared for by a hospitalist during your hospital stay. Once you are discharged, your primary care physician will handle any further medical issues. Please note that NO REFILLS for any discharge medications will be authorized once you are discharged, as it is imperative that you return to your primary care physician (or establish a relationship with a primary care physician if you do not have one) for your post hospital discharge needs so that they can reassess your need for medications and monitor your lab values.  Total Time spent coordinating discharge including counseling, education and face to face time equals 35 minutes.  Signed: Burk Hoctor 08/04/2018 11:03 AM

## 2018-08-04 NOTE — Care Management (Signed)
Case manager spoke with patient's daughter Kathlee Nations through [employer] [phone]). Confirmed that oxygen concentrator has been delivered to the home. Kathlee Nations said that she and her dad will be picking patient up from the hospital. Oxygen tank has been delivered to patient's room. Televisit appointment was previously arranged.     Ricki Miller, RN BSN Case Manager [phone]

## 2018-08-04 NOTE — Discharge Instructions (Signed)
Person Under Monitoring Name: Tanya Sims  Location: [place] [address]   Infection Prevention Recommendations for Individuals Confirmed to have, or Being Evaluated for, 2019 Novel Coronavirus (COVID-19) Infection Who Receive Care at Home  Individuals who are confirmed to have, or are being evaluated for, COVID-19 should follow the prevention steps below until a healthcare provider or local or state health department says they can return to normal activities.  Stay home except to get medical care You should restrict activities outside your home, except for getting medical care. Do not go to work, school, or public areas, and do not use public transportation or taxis.  Call ahead before visiting your doctor Before your medical appointment, call the healthcare provider and tell them that you have, or are being evaluated for, COVID-19 infection. This will help the healthcare providers office take steps to keep other people from getting infected. Ask your healthcare provider to call the local or state health department.  Monitor your symptoms Seek prompt medical attention if your illness is worsening (e.g., difficulty breathing). Before going to your medical appointment, call the healthcare provider and tell them that you have, or are being evaluated for, COVID-19 infection. Ask your healthcare provider to call the local or state health department.  Wear a facemask You should wear a facemask that covers your nose and mouth when you are in the same room with other people and when you visit a healthcare provider. People who live with or visit you should also wear a facemask while they are in the same room with you.  Separate yourself from other people in your home As much as possible, you should stay in a different room from other people in your home. Also, you should use a separate bathroom, if available.  Avoid sharing household items You should not  share dishes, drinking glasses, cups, eating utensils, towels, bedding, or other items with other people in your home. After using these items, you should wash them thoroughly with soap and water.  Cover your coughs and sneezes Cover your mouth and nose with a tissue when you cough or sneeze, or you can cough or sneeze into your sleeve. Throw used tissues in a lined trash can, and immediately wash your hands with soap and water for at least 20 seconds or use an alcohol-based hand rub.  Wash your [facility] your hands often and thoroughly with soap and water for at least 20 seconds. You can use an alcohol-based hand sanitizer if soap and water are not available and if your hands are not visibly dirty. Avoid touching your eyes, nose, and mouth with unwashed hands.   Prevention Steps for Caregivers and Household Members of Individuals Confirmed to have, or Being Evaluated for, COVID-19 Infection Being Cared for in the Home  If you live with, or provide care at home for, a person confirmed to have, or being evaluated for, COVID-19 infection please follow these guidelines to prevent infection:  Follow healthcare providers instructions Make sure that you understand and can help the patient follow any healthcare provider instructions for all care.  Provide for the patients basic needs You should help the patient with basic needs in the home and provide support for getting groceries, prescriptions, and other personal needs.  Monitor the patients symptoms If they are getting sicker, call his or her medical provider and tell them that the patient has, or is being evaluated for, COVID-19 infection. This will help the healthcare providers office  take steps to keep other people from getting infected. Ask the healthcare provider to call the local or state health department.  Limit the number of people who have contact with the patient  If possible, have only one caregiver for the  patient.  Other household members should stay in another home or place of residence. If this is not possible, they should stay  in another room, or be separated from the patient as much as possible. Use a separate bathroom, if available.  Restrict visitors who do not have an essential need to be in the home.  Keep older adults, very young children, and other sick people away from the patient Keep older adults, very young children, and those who have compromised immune systems or chronic health conditions away from the patient. This includes people with chronic heart, lung, or kidney conditions, diabetes, and cancer.  Ensure good ventilation Make sure that shared spaces in the home have good air flow, such as from an air conditioner or an opened window, weather permitting.  Wash your hands often  Wash your hands often and thoroughly with soap and water for at least 20 seconds. You can use an alcohol based hand sanitizer if soap and water are not available and if your hands are not visibly dirty.  Avoid touching your eyes, nose, and mouth with unwashed hands.  Use disposable paper towels to dry your hands. If not available, use dedicated cloth towels and replace them when they become wet.  Wear a facemask and gloves  Wear a disposable facemask at all times in the room and gloves when you touch or have contact with the patients blood, body fluids, and/or secretions or excretions, such as sweat, saliva, sputum, nasal mucus, vomit, urine, or feces.  Ensure the mask fits over your nose and mouth tightly, and do not touch it during use.  Throw out disposable facemasks and gloves after using them. Do not reuse.  Wash your hands immediately after removing your facemask and gloves.  If your personal clothing becomes contaminated, carefully remove clothing and launder. Wash your hands after handling contaminated clothing.  Place all used disposable facemasks, gloves, and other waste in a lined  container before disposing them with other household waste.  Remove gloves and wash your hands immediately after handling these items.  Do not share dishes, glasses, or other household items with the patient  Avoid sharing household items. You should not share dishes, drinking glasses, cups, eating utensils, towels, bedding, or other items with a patient who is confirmed to have, or being evaluated for, COVID-19 infection.  After the person uses these items, you should wash them thoroughly with soap and water.  Wash laundry thoroughly  Immediately remove and wash clothes or bedding that have blood, body fluids, and/or secretions or excretions, such as sweat, saliva, sputum, nasal mucus, vomit, urine, or feces, on them.  Wear gloves when handling laundry from the patient.  Read and follow directions on labels of laundry or clothing items and detergent. In general, wash and dry with the warmest temperatures recommended on the label.  Clean all areas the individual has used often  Clean all touchable surfaces, such as counters, tabletops, doorknobs, bathroom fixtures, toilets, phones, keyboards, tablets, and bedside tables, every day. Also, clean any surfaces that may have blood, body fluids, and/or secretions or excretions on them.  Wear gloves when cleaning surfaces the patient has come in contact with.  Use a diluted bleach solution (e.g., dilute bleach with 1 part  bleach and 10 parts water) or a household disinfectant with a label that says EPA-registered for coronaviruses. To make a bleach solution at home, add 1 tablespoon of bleach to 1 quart (4 cups) of water. For a larger supply, add  cup of bleach to 1 gallon (16 cups) of water.  Read labels of cleaning products and follow recommendations provided on product labels. Labels contain instructions for safe and effective use of the cleaning product including precautions you should take when applying the product, such as wearing gloves or  eye protection and making sure you have good ventilation during use of the product.  Remove gloves and wash hands immediately after cleaning.  Monitor yourself for signs and symptoms of illness Caregivers and household members are considered close contacts, should monitor their health, and will be asked to limit movement outside of the home to the extent possible. Follow the monitoring steps for close contacts listed on the symptom monitoring form.   ? If you have additional questions, contact your local health department or call the epidemiologist on call at [phone] (available 24/7). ? This guidance is subject to change. For the most up-to-date guidance from [facility], please refer to their website: TripMetro.huhttps://www.cdc.gov/coronavirus/2019-ncov/hcp/guidance-prevent-spread.html      Person Under Monitoring Name: Tanya LeydenMatilde Sims  Location: [address] [place] [id number]   CORONAVIRUS DISEASE 2019 (COVID-19) Guidance for Persons Under Investigation You are being tested for the virus that causes coronavirus disease 2019 (COVID-19). Public health actions are necessary to ensure protection of your health and the health of others, and to prevent further spread of infection. COVID-19 is caused by a virus that can cause symptoms, such as fever, cough, and shortness of breath. The primary transmission from person to person is by coughing or sneezing. On March 01, 2018, the World Health Organization announced a [employer] Emergency of International Concern and on March 02, 2018 the U.S. Department of Health and Human Services declared a public health emergency. If the virus that causesCOVID-19 spreads in the community, it could have severe public health consequences.  As a person under investigation for COVID-19, the [facility] [place] [place] Department of Health and [employer], Division of [employer] advises you to adhere to the following guidance until your test results are reported to  you. If your test result is positive, you will receive additional information from your provider and your local health department at that time.   Remain at home until you are cleared by your health provider or public health authorities.   Keep a log of visitors to your home using the form provided. Any visitors to your home must be aware of your isolation status.  If you plan to move to a new address or leave the county, notify the local health department in your county.  Call a doctor or seek care if you have an urgent medical need. Before seeking medical care, call ahead and get instructions from the provider before arriving at the medical office, clinic or hospital. Notify them that you are being tested for the virus that causes COVID-19 so arrangements can be made, as necessary, to prevent transmission to others in the healthcare setting. Next, notify the local health department in your county.  If a medical emergency arises and you need to call 911, inform the first responders that you are being tested for the virus that causes COVID-19. Next, notify the local health department in your county.  Adhere to all guidance set forth by the [facility]  of Public Health for Home Care of patients that is based on guidance from the Center for Disease Control and Prevention with suspected or confirmed COVID-19. It is provided with this guidance for Persons Under Investigation.  Your health and the health of our community are our top priorities. Public Health officials remain available to provide assistance and counseling to you about COVID-19 and compliance with this guidance.  Provider: ____________________________________________________________ Date: ______/_____/_________  By signing below, you acknowledge that you have read and agree to comply with this Guidance for Persons Under Investigation. ______________________________________________________________ Date:  ______/_____/_________  WHO DO I CALL? You can find a list of local health departments here: [email or web address] Health Department: ____________________________________________________________________ Contact Name: ________________________________________________________________________ Telephone: ___________________________________________________________________________  Marice Potter, [place], Communicable Disease Branch COVID-19 Guidance for Persons Under Investigation April 07, 2018

## 2018-08-06 LAB — GLUCOSE, CAPILLARY: Glucose-Capillary: 292 mg/dL — ABNORMAL HIGH (ref 70–99)

## 2018-08-14 ENCOUNTER — Ambulatory Visit: Payer: Self-pay | Attending: Internal Medicine | Admitting: Internal Medicine

## 2018-08-14 ENCOUNTER — Other Ambulatory Visit: Payer: Self-pay

## 2018-09-14 ENCOUNTER — Inpatient Hospital Stay: Payer: Self-pay | Admitting: Family Medicine

## 2018-11-09 ENCOUNTER — Ambulatory Visit: Payer: Self-pay | Attending: Internal Medicine | Admitting: Internal Medicine

## 2018-11-09 ENCOUNTER — Encounter: Payer: Self-pay | Admitting: Internal Medicine

## 2018-11-09 ENCOUNTER — Other Ambulatory Visit: Payer: Self-pay

## 2018-11-09 DIAGNOSIS — Z8616 Personal history of COVID-19: Secondary | ICD-10-CM

## 2018-11-09 DIAGNOSIS — R945 Abnormal results of liver function studies: Secondary | ICD-10-CM

## 2018-11-09 DIAGNOSIS — R7989 Other specified abnormal findings of blood chemistry: Secondary | ICD-10-CM

## 2018-11-09 DIAGNOSIS — Z8619 Personal history of other infectious and parasitic diseases: Secondary | ICD-10-CM

## 2018-11-09 DIAGNOSIS — M353 Polymyalgia rheumatica: Secondary | ICD-10-CM

## 2018-11-09 NOTE — Progress Notes (Signed)
Virtual Visit via Telephone Note Due to current restrictions/limitations of in-office visits due to the COVID-19 pandemic, this scheduled clinical appointment was converted to a telehealth visit  I connected with Tanya Sims on 11/09/18 at 8:57 a.m by telephone and verified that I am speaking with the correct person using two identifiers. I am in my office.  The patient is at home.  The patient, myself and Lawanna Kobus from [employer] [phone]) participated in this encounter.  I discussed the limitations, risks, security and privacy concerns of performing an evaluation and management service by telephone and the availability of in person appointments. I also discussed with the patient that there may be a patient responsible charge related to this service. The patient expressed understanding and agreed to proceed.   History of Present Illness: This patient is new to our practice.  She was hospitalized in July with hypoxic respiratory failure secondary to COVID pneumonia.  She responded well but had rising ALT to Remdesivir.  She was sent home on 2 L of O2.  Pt no longer using oxygen.  Reports she is still being charged for it. Company is [facility]  Pt c/o of having a lot of body aches since hosp 08/2018.  Cough has resolved.  No congestion.  No SOB. No fever per say but sometimes "at nights I get a little hot." No sick contacts.  No one in house sick Pt states she does not need any refills on albuterol or other meds that she was discgh from hosp with.   Outpatient Encounter Medications as of 11/09/2018  Medication Sig  . acetaminophen (TYLENOL) 500 MG tablet Take 1,000 mg by mouth every 6 (six) hours as needed for mild pain or fever.  Marland Kitchen albuterol (VENTOLIN HFA) 108 (90 Base) MCG/ACT inhaler Inhale 2 puffs into the lungs every 6 (six) hours as needed for wheezing or shortness of breath.  . benzonatate (TESSALON PERLES) 100 MG capsule Take 2 capsules (200 mg total) by mouth 3 (three)  times daily as needed for cough.  Marland Kitchen omeprazole (PRILOSEC) 40 MG capsule Take 1 capsule (40 mg total) by mouth daily for 30 days.   No facility-administered encounter medications on file as of 11/09/2018.     Observations/Objective:   Chemistry      Component Value Date/Time   NA 136 08/04/2018 0422   K 4.1 08/04/2018 0422   CL 100 08/04/2018 0422   CO2 26 08/04/2018 0422   BUN 29 (H) 08/04/2018 0422   CREATININE 0.71 08/04/2018 0422      Component Value Date/Time   CALCIUM 8.7 (L) 08/04/2018 0422   ALKPHOS 87 08/04/2018 0422   AST 40 08/04/2018 0422   ALT 174 (H) 08/04/2018 0422   BILITOT 0.6 08/04/2018 0422       Assessment and Plan: 1. Polymyalgia (HCC) Could be residual effects from COVID.    2. History of 2019 novel coronavirus disease (COVID-19) Message sent to care manager to have [facility] pick up the oxygen from her house as she no longer needs it.  3. Abnormal LFTs Patient will come to the lab next week to have liver function test done. - Hepatic Function Panel; Future   Follow Up Instructions: 4-8 wks for pap and breast exam   I discussed the assessment and treatment plan with the patient. The patient was provided an opportunity to ask questions and all were answered. The patient agreed with the plan and demonstrated an understanding of the instructions.   The patient  was advised to call back or seek an in-person evaluation if the symptoms worsen or if the condition fails to improve as anticipated.  I provided 18 minutes of non-face-to-face time during this encounter.   Karle Plumber, MD

## 2018-11-09 NOTE — Progress Notes (Signed)
Patient verified DOB Patient has eaten today. Patient has not taken medication today. Patient complains of generalized body aches and hair loss since being discharged. Patient complains of HA's.  Patient states she does have fevers within the past 2 weeks and took tylenol. Patient denies N/V or Diarrhea. Patient does have stomach cramping on the right side. Patient would like refills on albuterol inhaler and tessalon pearls. Patient states she has intermittent cough with no SOB

## 2018-11-12 ENCOUNTER — Ambulatory Visit: Payer: Self-pay | Attending: Family Medicine

## 2018-11-12 ENCOUNTER — Other Ambulatory Visit: Payer: Self-pay

## 2018-11-12 ENCOUNTER — Telehealth: Payer: Self-pay

## 2018-11-12 DIAGNOSIS — R7989 Other specified abnormal findings of blood chemistry: Secondary | ICD-10-CM

## 2018-11-12 DIAGNOSIS — R945 Abnormal results of liver function studies: Secondary | ICD-10-CM

## 2018-11-12 NOTE — Telephone Encounter (Signed)
Order to discontinue O2 faxed to [employer]

## 2018-11-13 ENCOUNTER — Telehealth: Payer: Self-pay

## 2018-11-13 LAB — HEPATIC FUNCTION PANEL
ALT: 92 IU/L — ABNORMAL HIGH (ref 0–32)
AST: 55 IU/L — ABNORMAL HIGH (ref 0–40)
Albumin: 4.4 g/dL (ref 3.8–4.9)
Alkaline Phosphatase: 100 IU/L (ref 39–117)
Bilirubin Total: 1.4 mg/dL — ABNORMAL HIGH (ref 0.0–1.2)
Bilirubin, Direct: 0.21 mg/dL (ref 0.00–0.40)
Total Protein: 7.3 g/dL (ref 6.0–8.5)

## 2018-11-13 NOTE — Telephone Encounter (Signed)
[employer] [place] Mote  Id#  [id number] contacted pt to go over lab results pt is aware and doesn't have any questions or concerns

## 2018-12-31 ENCOUNTER — Other Ambulatory Visit: Payer: Self-pay

## 2018-12-31 ENCOUNTER — Encounter: Payer: Self-pay | Admitting: Internal Medicine

## 2018-12-31 ENCOUNTER — Ambulatory Visit: Payer: Self-pay | Attending: Internal Medicine | Admitting: Internal Medicine

## 2018-12-31 VITALS — BP 124/75 | HR 92 | Temp 98.3°F | Resp 16 | Wt 178.0 lb

## 2018-12-31 DIAGNOSIS — R7989 Other specified abnormal findings of blood chemistry: Secondary | ICD-10-CM

## 2018-12-31 DIAGNOSIS — R7303 Prediabetes: Secondary | ICD-10-CM

## 2018-12-31 DIAGNOSIS — Z1211 Encounter for screening for malignant neoplasm of colon: Secondary | ICD-10-CM

## 2018-12-31 DIAGNOSIS — Z114 Encounter for screening for human immunodeficiency virus [HIV]: Secondary | ICD-10-CM

## 2018-12-31 DIAGNOSIS — R945 Abnormal results of liver function studies: Secondary | ICD-10-CM

## 2018-12-31 DIAGNOSIS — Z1231 Encounter for screening mammogram for malignant neoplasm of breast: Secondary | ICD-10-CM

## 2018-12-31 DIAGNOSIS — Z124 Encounter for screening for malignant neoplasm of cervix: Secondary | ICD-10-CM

## 2018-12-31 DIAGNOSIS — E669 Obesity, unspecified: Secondary | ICD-10-CM

## 2018-12-31 NOTE — Progress Notes (Signed)
Patient ID: Tanya Sims, female    DOB: 1960/05/05  MRN: [id number]  CC: Gynecologic Exam   Subjective: Tanya Sims is a 58 y.o. female who presents for GYN. Her concerns today include:  History of Covid pneumonia  Pt is G1P1 No abnormal pap in past. No vaginal dischg or itching.  Sexually active with one partner. Pt is postmenopausal.  Menses have stopped No fhx of breast, ovarian or uterine cancer Last MMG was 4 yrs ago.  It was nl.   Abn LFTs:  Elevated during COVID  Pneumonia.  Levels have been returning towards nl.  Last checked 4-6 wks ago  Obesity:  Reports that she gained more wgh when she was sick with COVID.  Tries to drink more water.  Loves potatoes.   -snacks on milk, bread, cookies and corn bread. -she walk but not often A1C in July was 6.2.  She states that she was not told that she had prediabetes while in the hospital which was when this level was checked  Patient Active Problem List   Diagnosis Date Noted  . Suspected COVID-19 virus infection 07/29/2018  . Acute respiratory failure with hypoxia (HCC) 07/29/2018  . Close exposure to COVID-19 virus 07/29/2018  . GERD (gastroesophageal reflux disease) 07/29/2018  . Right upper quadrant pain 07/29/2018  . Sepsis (HCC) 07/29/2018  . Viral pneumonia 07/29/2018  . COVID-19 virus infection 07/29/2018     Current Outpatient Medications on File Prior to Visit  Medication Sig Dispense Refill  . acetaminophen (TYLENOL) 500 MG tablet Take 1,000 mg by mouth every 6 (six) hours as needed for mild pain or fever.    Marland Kitchen albuterol (VENTOLIN HFA) 108 (90 Base) MCG/ACT inhaler Inhale 2 puffs into the lungs every 6 (six) hours as needed for wheezing or shortness of breath. (Patient not taking: Reported on 12/31/2018) 6.7 g 0  . benzonatate (TESSALON PERLES) 100 MG capsule Take 2 capsules (200 mg total) by mouth 3 (three) times daily as needed for cough. (Patient not taking: Reported on 12/31/2018) 30 capsule 0  .  omeprazole (PRILOSEC) 40 MG capsule Take 1 capsule (40 mg total) by mouth daily for 30 days. 30 capsule 0   No current facility-administered medications on file prior to visit.     No Known Allergies  Social History   Socioeconomic History  . Marital status: Married    Spouse name: Not on file  . Number of children: Not on file  . Years of education: Not on file  . Highest education level: Not on file  Occupational History  . Not on file  Social Needs  . Financial resource strain: Not on file  . Food insecurity    Worry: Not on file    Inability: Not on file  . Transportation needs    Medical: Not on file    Non-medical: Not on file  Tobacco Use  . Smoking status: Never Smoker  . Smokeless tobacco: Never Used  Substance and Sexual Activity  . Alcohol use: Never    Frequency: Never  . Drug use: Never  . Sexual activity: Yes  Lifestyle  . Physical activity    Days per week: Not on file    Minutes per session: Not on file  . Stress: Not on file  Relationships  . Social Musician on phone: Not on file    Gets together: Not on file    Attends religious service: Not on file    Active member  of club or organization: Not on file    Attends meetings of clubs or organizations: Not on file    Relationship status: Not on file  . Intimate partner violence    Fear of current or ex partner: Not on file    Emotionally abused: Not on file    Physically abused: Not on file    Forced sexual activity: Not on file  Other Topics Concern  . Not on file  Social History Narrative  . Not on file    No family history on file.  No past surgical history on file.  ROS: Review of Systems Negative except as stated above  PHYSICAL EXAM: BP 124/75   Pulse 92   Temp 98.3 F (36.8 C) (Oral)   Resp 16   Wt 178 lb (80.7 kg)   SpO2 96%   BMI 30.76 kg/m   Physical Exam  General appearance - alert, well appearing, obese middle-aged Hispanic female and in no distress  Mental status - normal mood, behavior, speech, dress, motor activity, and thought processes Neck - supple, no significant adenopathy Lymphatics -no cervical or axillary lymphadenopathy  chest - clear to auscultation, no wheezes, rales or rhonchi, symmetric air entry Heart - normal rate, regular rhythm, normal S1, S2, no murmurs, rubs, clicks or gallops Abdomen -obese, soft, nontender, nondistended, no masses or organomegaly Breasts -CMA Pollock present.  Breasts appear normal, no suspicious masses, no skin or nipple changes or axillary nodes Pelvic -CMA Pollock present.  Normal external genitalia, vulva, vagina, cervix, uterus and adnexa   CMP Latest Ref Rng & Units 11/12/2018 08/04/2018 08/03/2018  Glucose 70 - 99 mg/dL - 134(H) 129(H)  BUN 6 - 20 mg/dL - 29(H) 27(H)  Creatinine 0.44 - 1.00 mg/dL - 0.71 0.71  Sodium 135 - 145 mmol/L - 136 137  Potassium 3.5 - 5.1 mmol/L - 4.1 4.0  Chloride 98 - 111 mmol/L - 100 104  CO2 22 - 32 mmol/L - 26 25  Calcium 8.9 - 10.3 mg/dL - 8.7(L) 8.4(L)  Total Protein 6.0 - 8.5 g/dL 7.3 6.6 6.1(L)  Total Bilirubin 0.0 - 1.2 mg/dL 1.4(H) 0.6 0.4  Alkaline Phos 39 - 117 IU/L 100 87 87  AST 0 - 40 IU/L 55(H) 40 41  ALT 0 - 32 IU/L 92(H) 174(H) 186(H)   Lipid Panel     Component Value Date/Time   CHOL 252 (H) 04/02/2009 2040   TRIG 107 07/28/2018 2210   HDL 53 04/02/2009 2040   CHOLHDL 4.8 Ratio 04/02/2009 2040   VLDL 26 04/02/2009 2040   LDLCALC 173 (H) 04/02/2009 2040    CBC    Component Value Date/Time   WBC 13.9 (H) 08/04/2018 0422   RBC 4.51 08/04/2018 0422   HGB 12.7 08/04/2018 0422   HCT 40.6 08/04/2018 0422   PLT 667 (H) 08/04/2018 0422   MCV 90.0 08/04/2018 0422   MCH 28.2 08/04/2018 0422   MCHC 31.3 08/04/2018 0422   RDW 14.0 08/04/2018 0422   LYMPHSABS 1.3 08/04/2018 0422   MONOABS 1.0 08/04/2018 0422   EOSABS 0.0 08/04/2018 0422   BASOSABS 0.1 08/04/2018 0422    ASSESSMENT AND PLAN: 1. Pap smear for cervical cancer screening  - Cervicovaginal ancillary only - Cytology - PAP([place])  2. Screening for colon cancer Discussed the need for colon cancer screening and methods of screening that can be done.  She is uninsured.  She is agreeable to doing the fit test. - Fecal occult blood, imunochemical([facility])  3.  Encounter for screening mammogram for malignant neoplasm of breast Given scholarship application today - MM Digital Screening; Future  4. Abnormal LFTs - Hepatic Function Panel - Hepatitis C Antibody  5. Obesity (BMI 30-39.9) 6. Prediabetes Dietary counseling given.  Advised to eliminate sugary drinks from the diet.  Advised to cut back on white carbohydrates red meat and to incorporate fresh fruits and vegetables into the diet.  Encouraged to engage in some form of moderate intensity exercise at least 150 minutes/week - Hemoglobin A1c - Lipid panel - CBC  7. Screening for HIV (human immunodeficiency virus) - HIV antibody (with reflex)  Patient was given the opportunity to ask questions.  Patient verbalized understanding of the plan and was able to repeat key elements of the plan.  [employer] interpreter used during this encounter. #[id number]   Orders Placed This Encounter  Procedures  . Fecal occult blood, imunochemical([facility])  . MM Digital Screening  . Hepatic Function Panel  . Hepatitis C Antibody  . Hemoglobin A1c  . Lipid panel  . CBC  . HIV antibody (with reflex)     Requested Prescriptions    No prescriptions requested or ordered in this encounter    Return in about 4 months (around 04/30/2019).  Jonah Blueeborah Cherelle Midkiff, MD, FACP

## 2018-12-31 NOTE — Patient Instructions (Signed)
Plan de alimentacin para personas con prediabetes Prediabetes Eating Plan La prediabetes es una afeccin que hace que los niveles de azcar en la sangre (glucosa) sean ms altos de lo normal. Esto aumenta el riesgo de tener diabetes. Para prevenir la diabetes, es posible que su mdico le recomiende cambios en la dieta y otros cambios en su estilo de vida que lo ayuden a lograr lo siguiente:  Controlar los niveles de glucemia.  Mejorar los niveles de colesterol.  Controlar la presin arterial. El mdico puede recomendarle que trabaje con un especialista en alimentacin y nutricin (nutricionista) para elaborar el plan de comidas ms conveniente para usted. Consejos para seguir este plan: Estilo de vida  Establezca metas para bajar de peso con la ayuda de su equipo de atencin mdica. A la mayora de las personas con prediabetes se les recomienda bajar un 7% de su peso corporal.  Haga ejercicio al menos 30minutos 5das por semana, como mnimo.  Asista a un grupo de apoyo o solicite el apoyo continuo de un consejero de salud mental.  Tome los medicamentos de venta libre y los recetados solamente como se lo haya indicado el mdico. Leer las etiquetas de los alimentos  Lea las etiquetas de los alimentos envasados para controlar la cantidad de grasa, sal (sodio) y azcar que contienen. Evite los alimentos que contengan lo siguiente: ? Grasas saturadas. ? Grasas trans. ? Azcares agregados.  Evite los alimentos que contengan ms de 300miligramos(mg) de sodio por porcin. Limite el consumo diario de sodio a menos de 2300mg por da. De compras  Evite comprar alimentos procesados y preelaborados. Coccin  Cocine con aceite de oliva. No use mantequilla, manteca de cerdo o mantequilla clarificada.  Cocine los alimentos al horno, a la parrilla, asados o hervidos. Evite frerlos. Planificacin de las comidas   Trabaje con el nutricionista para crear un plan de alimentacin que sea  adecuado para usted. Esto puede incluir lo siguiente: ? Registro de la cantidad de caloras que ingiere. Use un registro de alimentos, un cuaderno o una aplicacin mvil para anotar lo que comi en cada comida. ? Uso del ndice glucmico (IG) para planificar las comidas. El ndice muestra con qu rapidez elevar la glucemia un alimento. Elija alimentos con bajo IG. Estos demoran ms en elevar la glucemia.  Considere la posibilidad de seguir una dieta mediterrnea. Esta dieta incluye lo siguiente: ? Varias porciones de frutas y verduras frescas por da. ? Pescado al menos dos veces por semana. ? Varias porciones de cereales integrales, frijoles, frutos secos y semillas por da. ? Aceite de oliva en lugar de otras grasas. ? Consumo moderado de alcohol. ? Pequeas cantidades de carnes rojas y lcteos enteros.  Si tiene hipertensin arterial, quizs deba limitar el consumo de sodio o seguir una dieta como el plan de alimentacin basado en Enfoques Alimentarios para Detener la Hipertensin (Dietary Approaches to Stop Hypertension, DASH). Este es un plan de alimentacin cuyo objetivo es bajar la hipertensin arterial. Qu alimentos se recomiendan? Es posible que los alimentos incluidos a continuacin no constituyan la lista completa. Hable con el nutricionista sobre las mejores opciones alimenticias para usted. Cereales Productos integrales, como panes, galletas, cereales y pastas de salvado o integrales. Avena sin azcar. Trigo burgol. Cebada. Quinua. Arroz integral. Tacos o tortillas de harina de maz o de salvado. Verduras Lechuga. Espinaca. Guisantes. Remolachas. Coliflor. Repollo. Brcoli. Zanahorias. Tomates. Calabaza. Berenjena. Hierbas. Pimienta. Cebollas. Pepinos. Repollitos de Bruselas. Frutas Frutos rojos. Bananas. Manzanas. Naranjas. Uvas. Papaya. Mango. Granada. Kiwi. Pomelo.   Cerezas. Carnes y otros alimentos ricos en protenas Mariscos. Carne de ave sin piel. Cortes magros de cerdo y  carne de res. Tofu. Huevos. Frutos secos. Frijoles. Lcteos Productos lcteos descremados o semidescremados, como yogur, queso cottage y queso. Bebidas Agua. T. Caf. Gaseosas sin azcar o dietticas. Soda. Leche descremada o semidescremada. Productos alternativos a la leche, como leche de soja o de almendras. Grasas y aceites Aceite de oliva. Aceite de canola. Aceite de girasol. Aceite de semillas de uva. Aguacate. Nueces. Dulces y postres Pudin sin azcar o con bajo contenido de grasa. Helado y otros postres congelados sin azcar o con bajo contenido de grasa. Condimentos y otros alimentos Hierbas. Especias sin sodio. Mostaza. Salsa de pepinillos. Ktchup con bajo contenido de grasa y de azcar. Salsa barbacoa con bajo contenido de grasa y de azcar. Mayonesa con bajo contenido de grasa o sin grasa. Qu alimentos no se recomiendan? Es posible que los alimentos incluidos a continuacin no constituyan la lista completa. Hable con el nutricionista sobre las mejores opciones alimenticias para usted. Cereales Productos elaborados con harina y harina blanca refinada, como panes, pastas, bocadillos y cereales. Verduras Verduras enlatadas. Verduras congeladas con mantequilla o salsa de crema. Frutas Frutas enlatadas al almbar. Carnes y otros alimentos ricos en protenas Cortes de carne con grasa. Carne de ave con piel. Carne empanizada o frita. Carnes procesadas. Lcteos Yogur, queso o leche enteros. Bebidas Bebidas azucaradas, como t helado dulce y gaseosas. Grasas y aceites Mantequilla. Manteca de cerdo. Mantequilla clarificada. Dulces y postres Productos horneados, como pasteles, pastelitos, galletas dulces y tarta de queso. Condimentos y otros alimentos Mezclas de especias con sal agregada. Ktchup. Salsa barbacoa. Mayonesa. Resumen  Para prevenir la diabetes, es posible que deba hacer cambios en la dieta y otros cambios en su estilo de vida para ayudar a controlar el azcar en la  sangre, mejorar los niveles de colesterol y controlar la presin arterial.  Establezca metas para bajar de peso con la ayuda de su equipo de atencin mdica. A la mayora de las personas con prediabetes se les recomienda bajar un 7por ciento de su peso corporal.  Considere la posibilidad de seguir una dieta mediterrnea que incluya muchas frutas y verduras frescas, cereales integrales, frijoles, frutos secos, semillas, pescado, carnes magras, lcteos descremados y aceites saludables. Esta informacin no tiene como fin reemplazar el consejo del mdico. Asegrese de hacerle al mdico cualquier pregunta que tenga. Document Released: 10/08/2014 Document Revised: 08/01/2016 Document Reviewed: 08/01/2016 Elsevier Patient Education  2020 [employer]  

## 2019-01-01 ENCOUNTER — Telehealth: Payer: Self-pay

## 2019-01-01 LAB — CBC
Hematocrit: 45.4 % (ref 34.0–46.6)
Hemoglobin: 14.6 g/dL (ref 11.1–15.9)
MCH: 28.2 pg (ref 26.6–33.0)
MCHC: 32.2 g/dL (ref 31.5–35.7)
MCV: 88 fL (ref 79–97)
Platelets: 373 10*3/uL (ref 150–450)
RBC: 5.18 x10E6/uL (ref 3.77–5.28)
RDW: 13.7 % (ref 11.7–15.4)
WBC: 4.8 10*3/uL (ref 3.4–10.8)

## 2019-01-01 LAB — CERVICOVAGINAL ANCILLARY ONLY
Bacterial Vaginitis (gardnerella): NEGATIVE
Candida Glabrata: NEGATIVE
Candida Vaginitis: NEGATIVE
Chlamydia: NEGATIVE
Comment: NEGATIVE
Comment: NEGATIVE
Comment: NEGATIVE
Comment: NEGATIVE
Comment: NEGATIVE
Comment: NORMAL
Neisseria Gonorrhea: NEGATIVE
Trichomonas: NEGATIVE

## 2019-01-01 LAB — LIPID PANEL
Chol/HDL Ratio: 8.6 ratio — ABNORMAL HIGH (ref 0.0–4.4)
Cholesterol, Total: 320 mg/dL — ABNORMAL HIGH (ref 100–199)
HDL: 37 mg/dL — ABNORMAL LOW (ref >39)
LDL Chol Calc (NIH): 236 mg/dL — ABNORMAL HIGH (ref 0–99)
Triglycerides: 231 mg/dL — ABNORMAL HIGH (ref 0–149)
VLDL Cholesterol Cal: 47 mg/dL — ABNORMAL HIGH (ref 5–40)

## 2019-01-01 LAB — HEPATIC FUNCTION PANEL
ALT: 72 IU/L — ABNORMAL HIGH (ref 0–32)
AST: 46 IU/L — ABNORMAL HIGH (ref 0–40)
Albumin: 4.4 g/dL (ref 3.8–4.9)
Alkaline Phosphatase: 108 IU/L (ref 39–117)
Bilirubin Total: 1.4 mg/dL — ABNORMAL HIGH (ref 0.0–1.2)
Bilirubin, Direct: 0.22 mg/dL (ref 0.00–0.40)
Total Protein: 7.4 g/dL (ref 6.0–8.5)

## 2019-01-01 LAB — HIV ANTIBODY (ROUTINE TESTING W REFLEX): HIV Screen 4th Generation wRfx: NONREACTIVE

## 2019-01-01 LAB — HEPATITIS C ANTIBODY: Hep C Virus Ab: <0.1 s/co ratio (ref 0.0–0.9)

## 2019-01-01 LAB — HEMOGLOBIN A1C
Est. average glucose Bld gHb Est-mCnc: 123 mg/dL
Hgb A1c MFr Bld: 5.9 % — ABNORMAL HIGH (ref 4.8–5.6)

## 2019-01-01 NOTE — Telephone Encounter (Signed)
[facility]  Id# [phone]  contacted pt to go over lab results and to schedule an appointment pt is aware of lab results and she is schedule for a lab visit for 1/4

## 2019-01-03 LAB — CYTOLOGY - PAP
Comment: NEGATIVE
Diagnosis: NEGATIVE
Diagnosis: REACTIVE
High risk HPV: NEGATIVE

## 2019-01-09 LAB — FECAL OCCULT BLOOD, IMMUNOCHEMICAL: Fecal Occult Bld: NEGATIVE

## 2019-02-04 ENCOUNTER — Other Ambulatory Visit: Payer: Self-pay

## 2019-02-04 ENCOUNTER — Ambulatory Visit: Payer: Self-pay | Attending: Internal Medicine

## 2019-02-04 ENCOUNTER — Other Ambulatory Visit: Payer: Self-pay | Admitting: Internal Medicine

## 2019-02-04 DIAGNOSIS — E785 Hyperlipidemia, unspecified: Secondary | ICD-10-CM

## 2019-02-04 DIAGNOSIS — R7989 Other specified abnormal findings of blood chemistry: Secondary | ICD-10-CM

## 2019-02-04 DIAGNOSIS — R945 Abnormal results of liver function studies: Secondary | ICD-10-CM

## 2019-02-05 LAB — HEPATIC FUNCTION PANEL
ALT: 50 IU/L — ABNORMAL HIGH (ref 0–32)
AST: 29 IU/L (ref 0–40)
Albumin: 4.1 g/dL (ref 3.8–4.9)
Alkaline Phosphatase: 95 IU/L (ref 39–117)
Bilirubin Total: 1 mg/dL (ref 0.0–1.2)
Bilirubin, Direct: 0.17 mg/dL (ref 0.00–0.40)
Total Protein: 6.4 g/dL (ref 6.0–8.5)

## 2019-02-05 LAB — LIPID PANEL
Chol/HDL Ratio: 7.1 ratio — ABNORMAL HIGH (ref 0.0–4.4)
Cholesterol, Total: 275 mg/dL — ABNORMAL HIGH (ref 100–199)
HDL: 39 mg/dL — ABNORMAL LOW (ref >39)
LDL Chol Calc (NIH): 204 mg/dL — ABNORMAL HIGH (ref 0–99)
Triglycerides: 170 mg/dL — ABNORMAL HIGH (ref 0–149)
VLDL Cholesterol Cal: 32 mg/dL (ref 5–40)

## 2019-02-06 NOTE — Progress Notes (Signed)
Let pt know that her enzymes have almost normalized.   Her total and LDL cholesterol are still elevated but improved from 1 mth ago.  Continue to work on trying to eat healthier and exercise.   Will recheck cholesterol again on next visit. The 10-year ASCVD risk score Denman George DC Montez Hageman., et al., 2013) is: 4.1%   Values used to calculate the score:     Age: 59 years     Sex: Female     Is Non-Hispanic African American: No     Diabetic: No     Tobacco smoker: No     Systolic Blood Pressure: 124 mmHg     Is BP treated: No     HDL Cholesterol: 39 mg/dL     Total Cholesterol: 275 mg/dL

## 2019-02-08 ENCOUNTER — Telehealth: Payer: Self-pay

## 2019-02-08 NOTE — Telephone Encounter (Signed)
[facility] Judeth [place]field  Id#  [id number] contacted pt to go over lab results pt is aware and doesn;t have any questions or concerns

## 2019-03-06 ENCOUNTER — Other Ambulatory Visit: Payer: Self-pay

## 2019-03-06 ENCOUNTER — Ambulatory Visit
Admission: RE | Admit: 2019-03-06 | Discharge: 2019-03-06 | Disposition: A | Discharge status: Home / Self Care | Payer: No Typology Code available for payment source | Source: Ambulatory Visit | Attending: Internal Medicine | Admitting: Internal Medicine

## 2019-03-06 DIAGNOSIS — Z1231 Encounter for screening mammogram for malignant neoplasm of breast: Secondary | ICD-10-CM

## 2019-03-19 ENCOUNTER — Telehealth: Payer: Self-pay

## 2019-03-19 NOTE — Telephone Encounter (Signed)
[facility] angelica  Id# [id number]  contacted pt to go over MM results pt didn't answer left a detailed vm informing pt of results and if she has any questions or concerns to give me a call

## 2021-03-20 IMAGING — DX PORTABLE CHEST - 1 VIEW
1 series · 1 of 1 positions shown · non-contrast
Comparison: Portable exam 0021 hours without priors for comparison

CLINICAL DATA: Cough, fever, nausea, vomiting, and worsening chest
pain for 8 days, has been in close contact with a [id number] positive
individual

EXAM:
PORTABLE CHEST 1 VIEW

[chest ap]
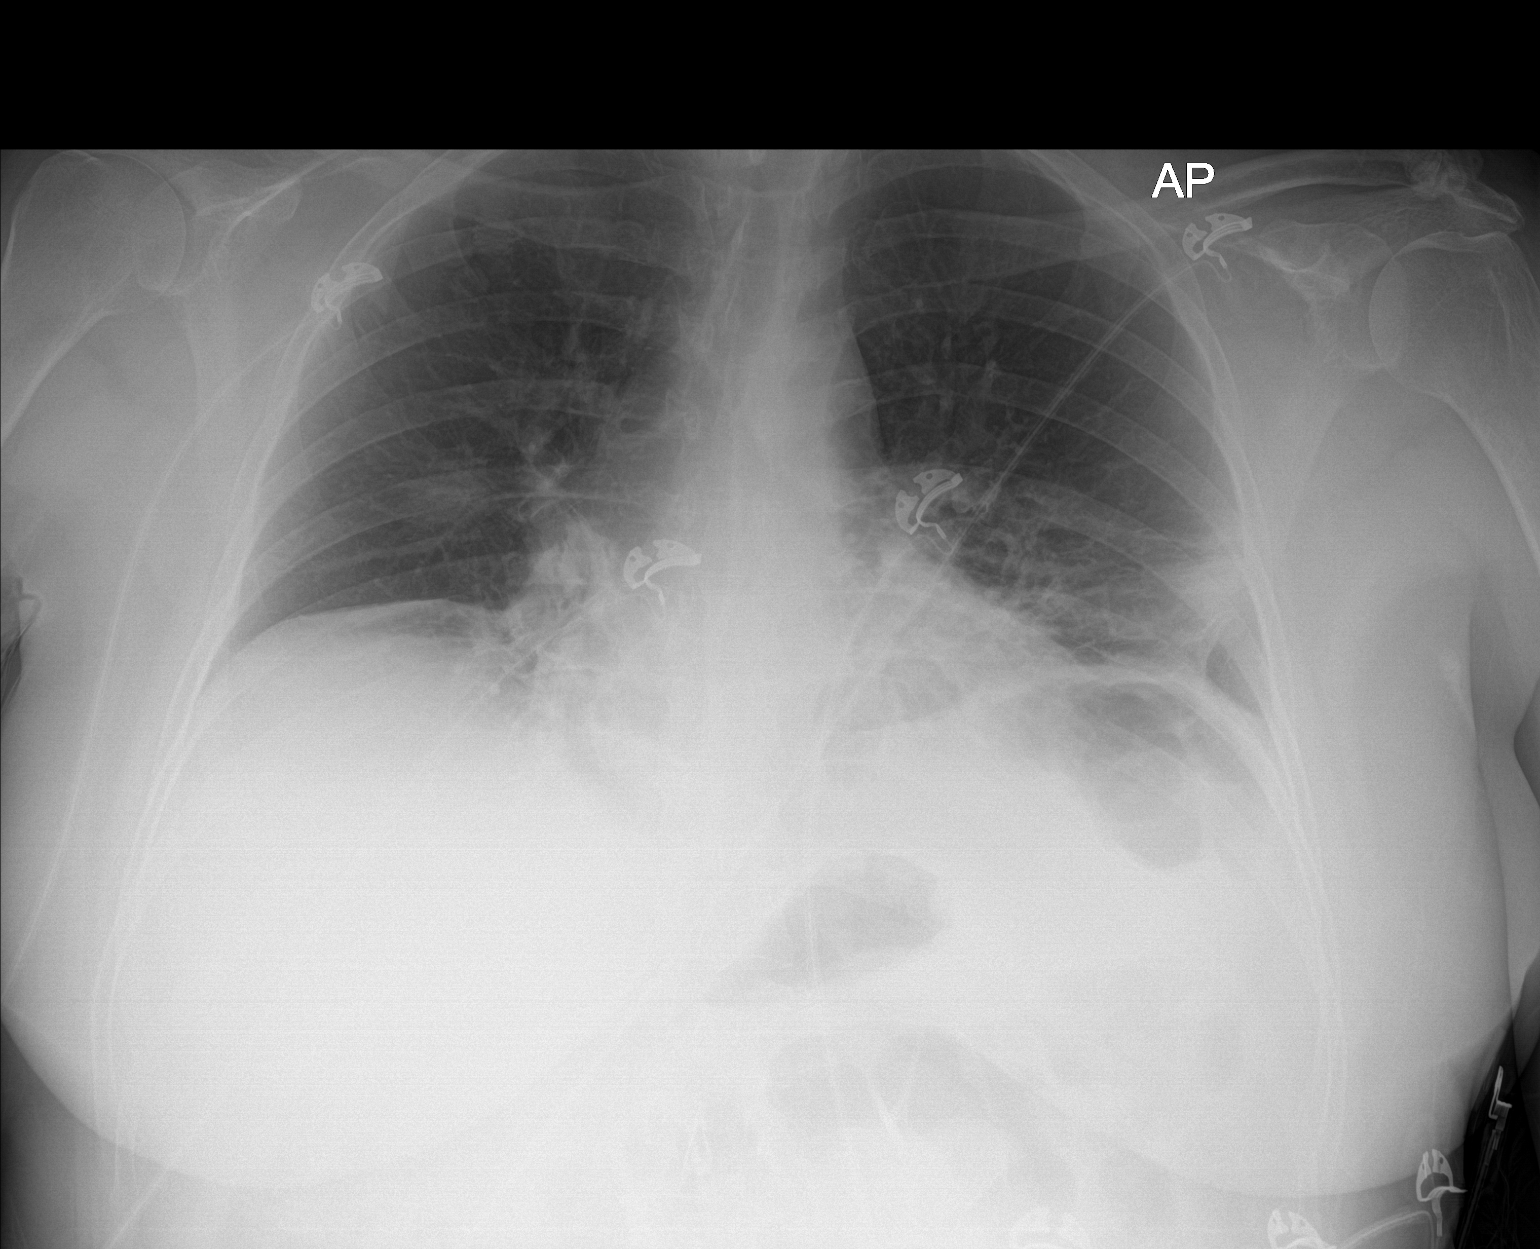

[1 of 1 positions shown; findings below may reference images not displayed]

FINDINGS: Normal heart size, mediastinal contours and pulmonary vascularity.

Hazy infiltrate RIGHT mid lung.

Additional infiltrate versus atelectasis at LEFT base.

Upper lungs clear.

No pleural effusion or pneumothorax.

Bones demineralized.
IMPRESSION: Hazy infiltrate RIGHT mid lung with more prominent opacity at LEFT
base which could represent infiltrate or atelectasis.

## 2021-03-22 IMAGING — DX PORTABLE CHEST - 1 VIEW
1 series · 1 of 1 positions shown · non-contrast
Comparison: CT 07/29/2018.  Chest x-ray 07/28/2018.

CLINICAL DATA: Shortness of breath.  [id number].

EXAM:
PORTABLE CHEST 1 VIEW

[chest]
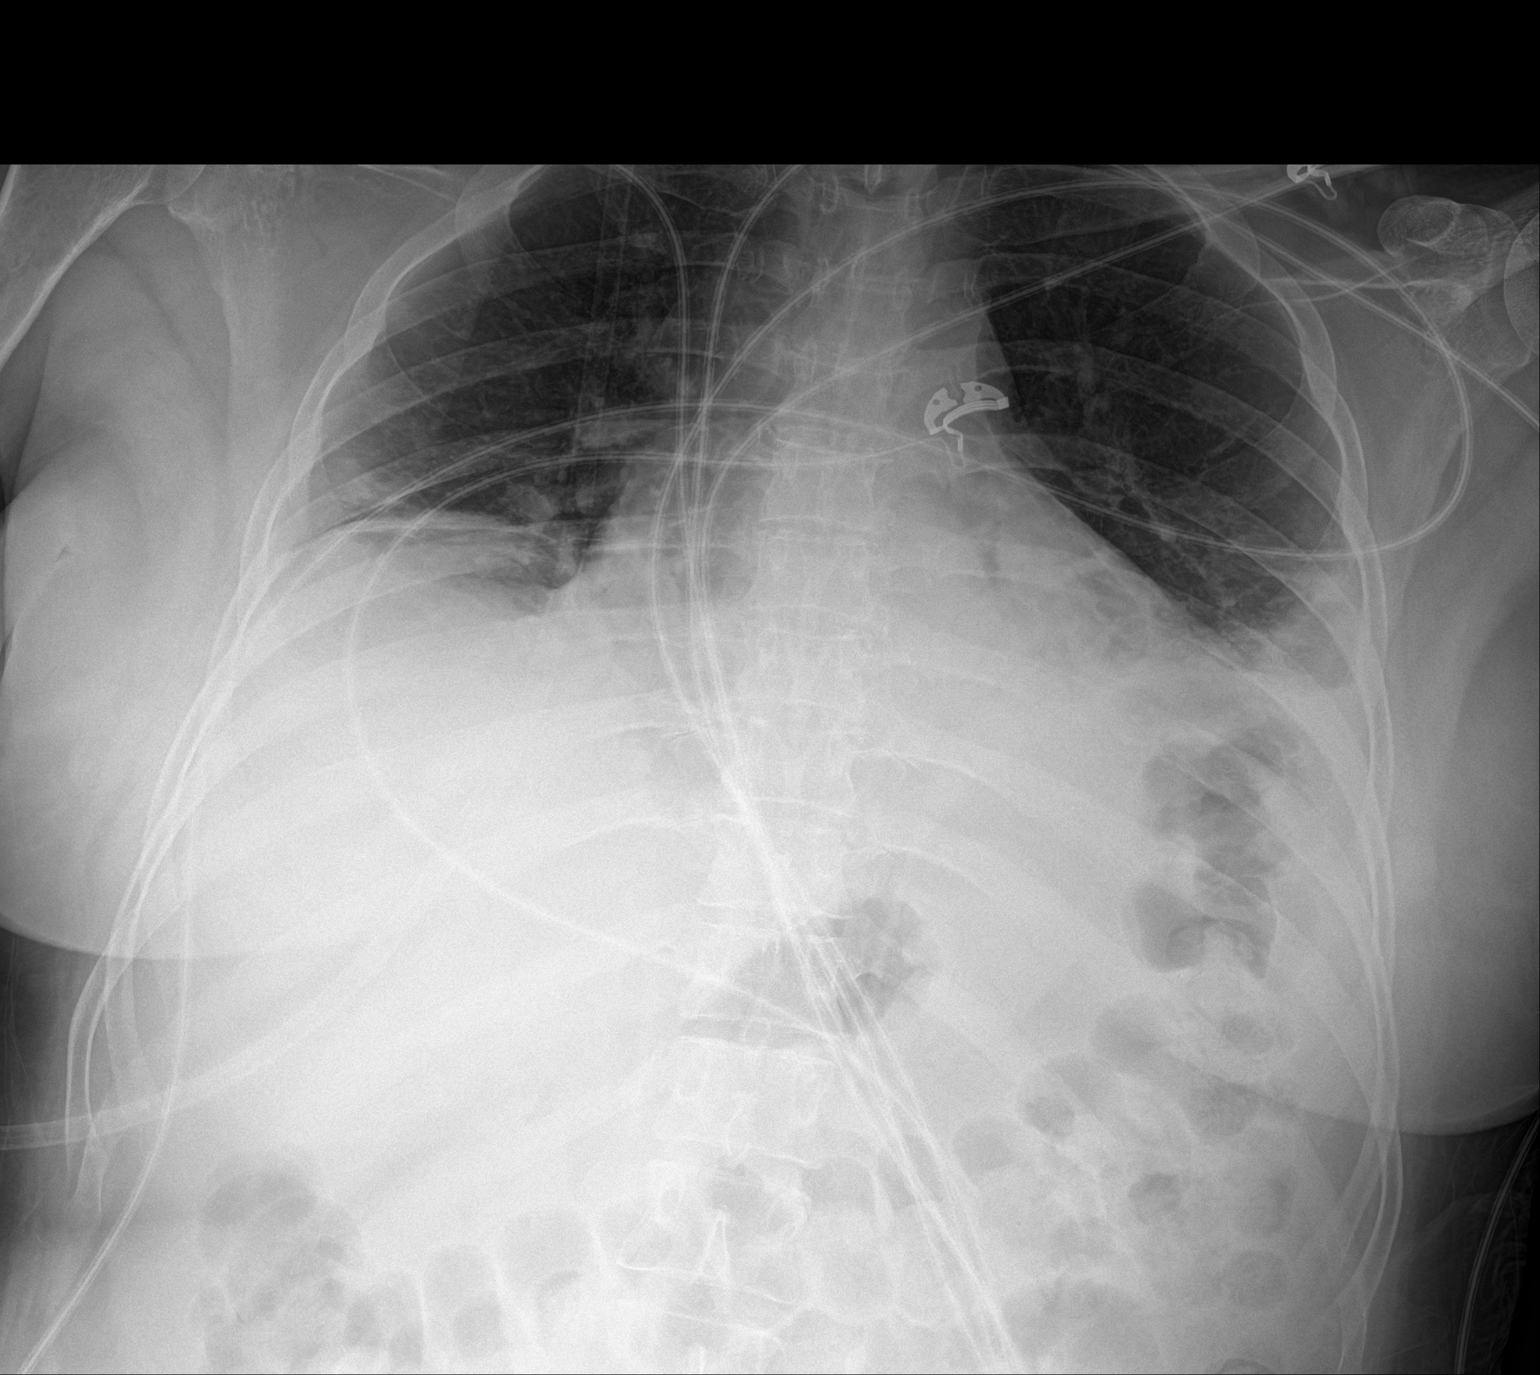

[1 of 1 positions shown; findings below may reference images not displayed]

FINDINGS: Mediastinum hilar structures normal. Heart size stable. Persistent
dense bibasilar atelectasis/infiltrates. Tiny bilateral pleural
effusions cannot be excluded. No pneumothorax.
IMPRESSION: Persistent dense bibasilar atelectasis/infiltrates. Tiny bilateral
pleural effusions cannot be excluded. Similar findings noted on
prior CT.

## 2021-10-27 IMAGING — MG DIGITAL SCREENING BILAT W/ TOMO W/ CAD
8 series · 8 of 24 positions shown · non-contrast
Comparison: Previous exam(s).

CLINICAL DATA: Screening.

EXAM:
DIGITAL SCREENING BILATERAL MAMMOGRAM WITH TOMO AND CAD

[R MLO synth-2D]
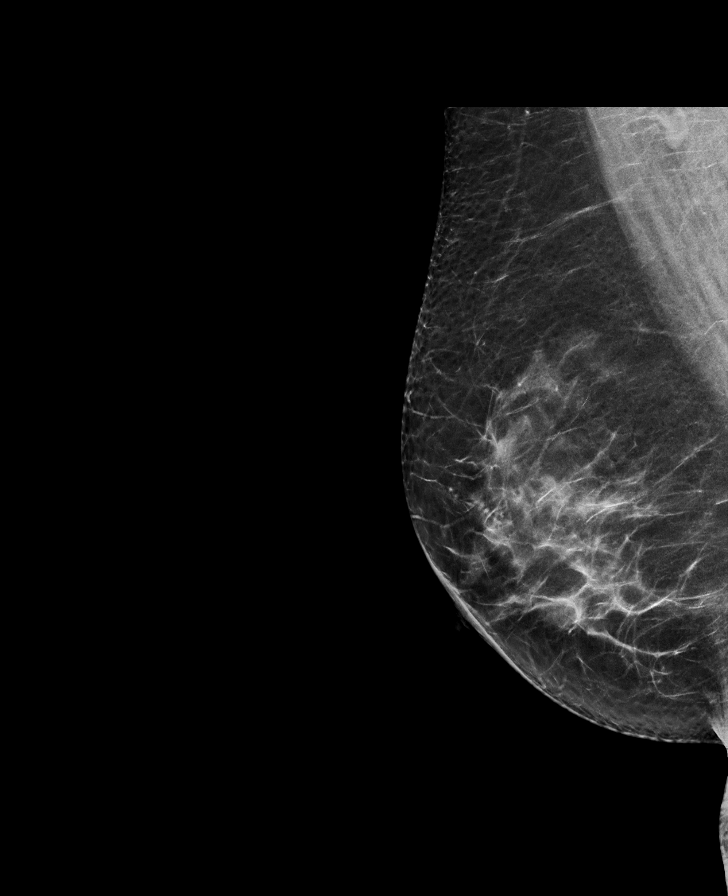

[R CC synth-2D]
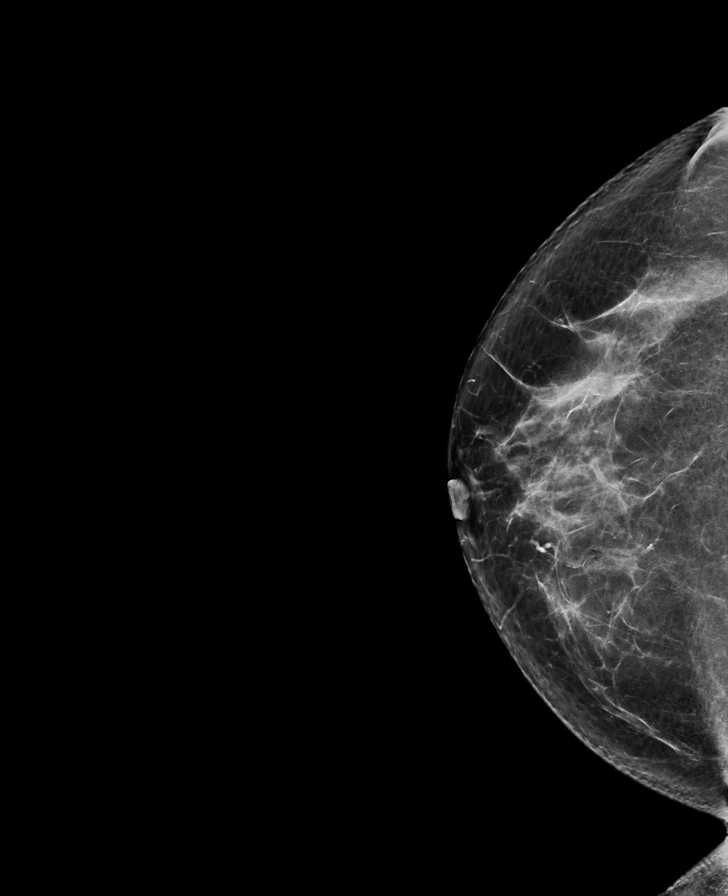

[L CC synth-2D]
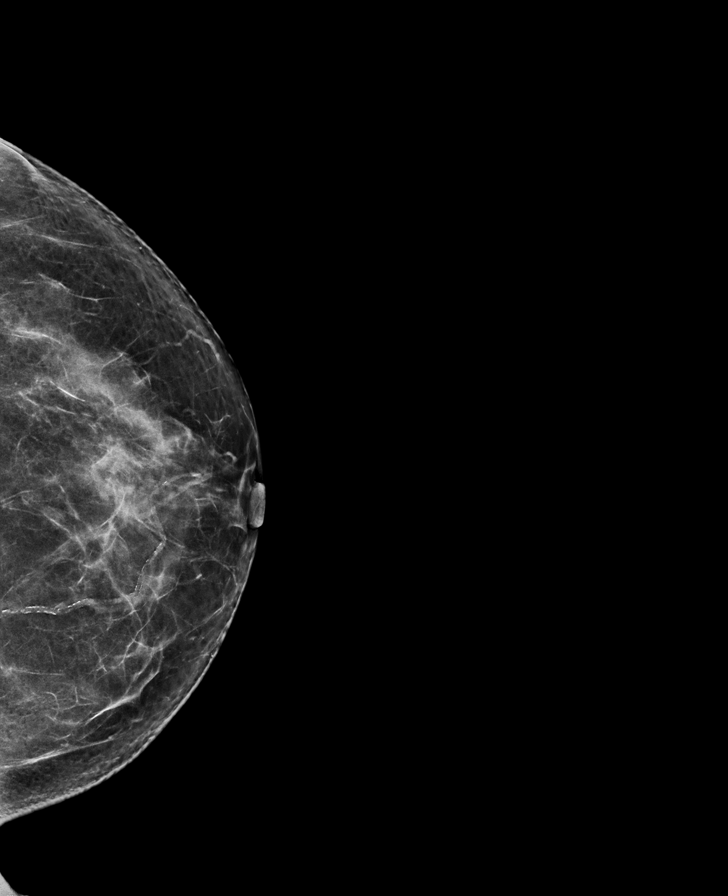

[L MLO synth-2D]
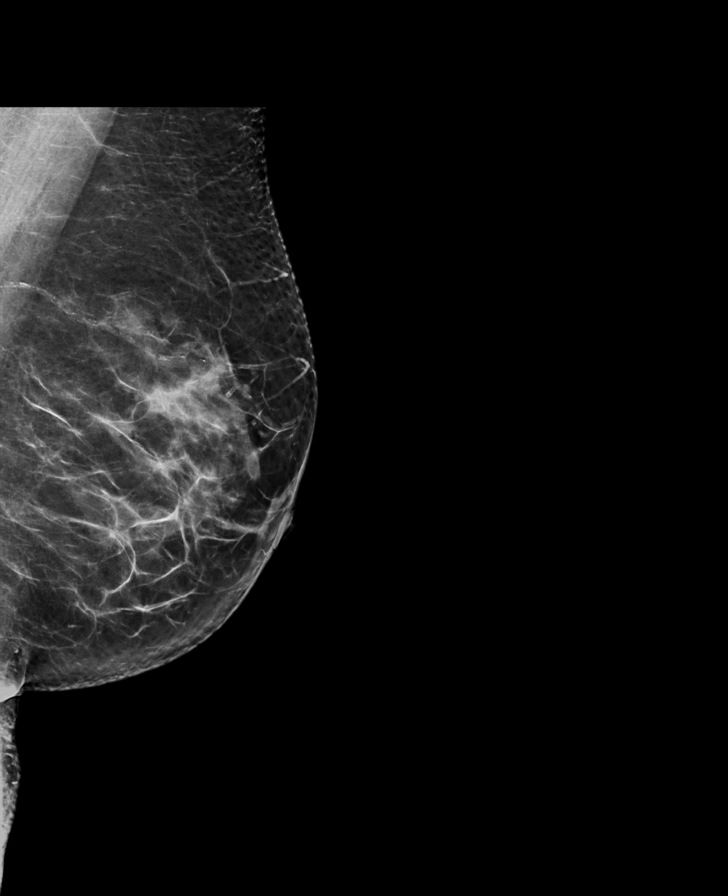

[L MLO tomo · tomo slice 41/81.0]
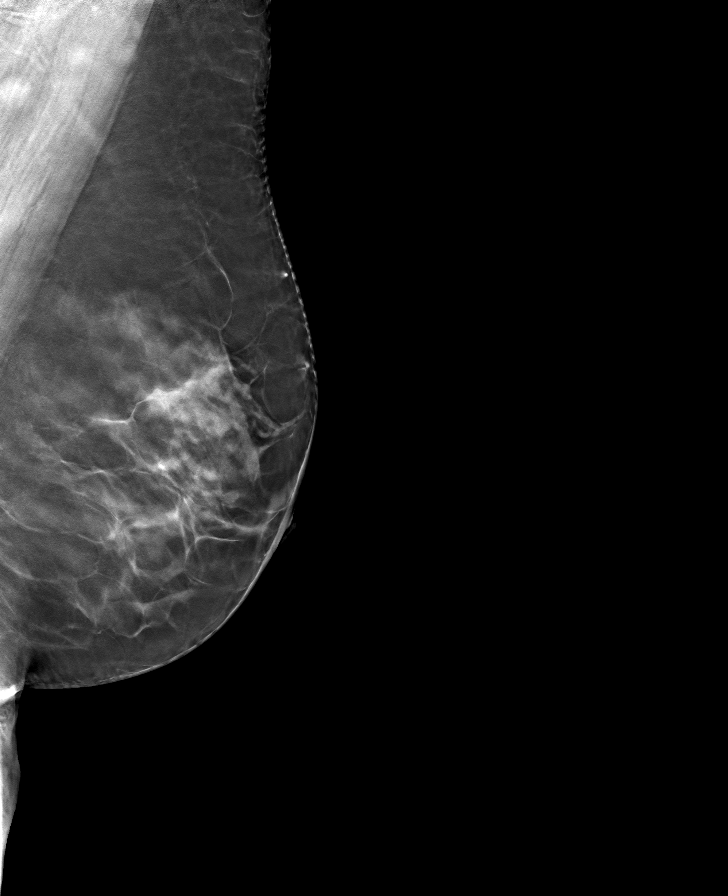

[L CC tomo · tomo slice 39/76.0]
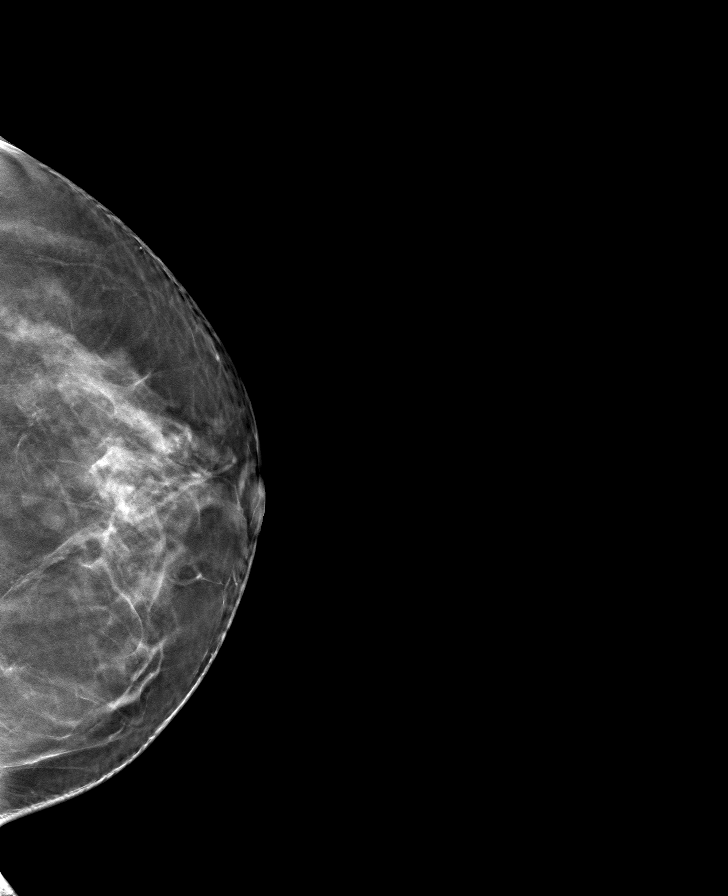

[R CC tomo · tomo slice 41/80.0]
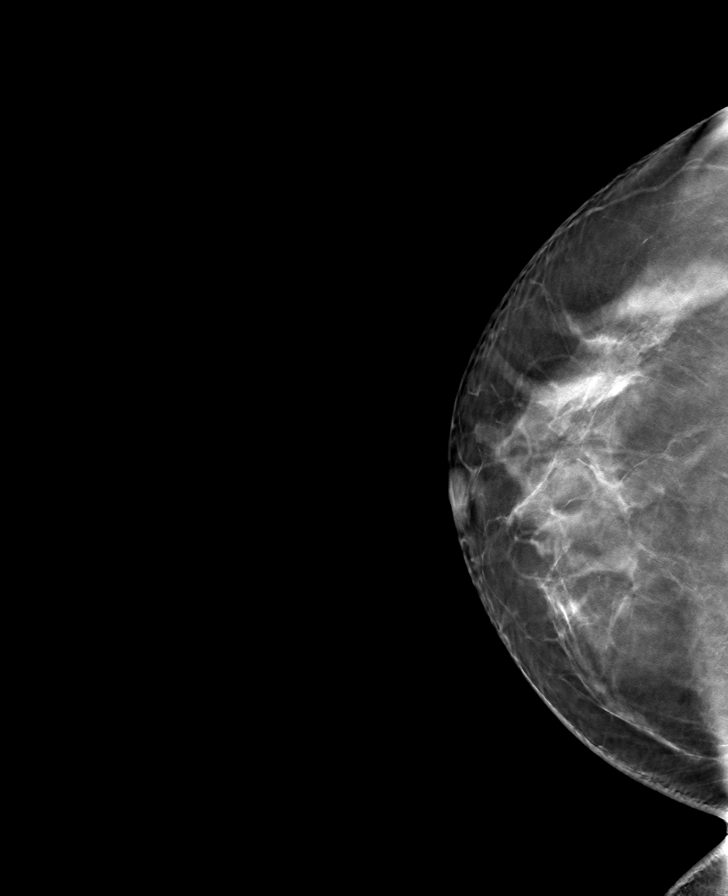

[R MLO tomo · tomo slice 39/77.0]
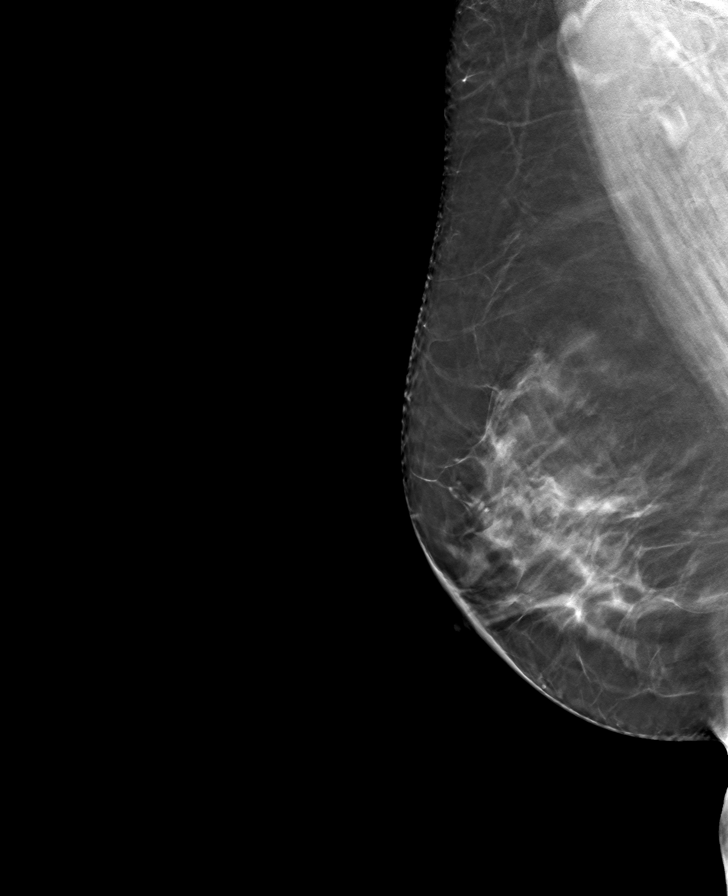

[8 of 24 positions shown; findings below may reference images not displayed]

ACR Breast Density Category c: The breast tissue is heterogeneously
dense, which may obscure small masses.
FINDINGS: There are no findings suspicious for malignancy. Images were
processed with CAD.
IMPRESSION: No mammographic evidence of malignancy. A result letter of this
screening mammogram will be mailed directly to the patient.

RECOMMENDATION:
Screening mammogram in one year. (Code:[id number])

BI-RADS CATEGORY  1: Negative.
